# Patient Record
Sex: Female | Born: 2000 | Race: Black or African American | Hispanic: No | Marital: Single | Smoking: Never smoker
Health system: Southern US, Community
[De-identification: ages and names within clinical notes are randomized; demographics above are authoritative.]

## PROBLEM LIST (undated history)

## (undated) DIAGNOSIS — L732 Hidradenitis suppurativa: Secondary | ICD-10-CM

## (undated) DIAGNOSIS — F419 Anxiety disorder, unspecified: Secondary | ICD-10-CM

## (undated) DIAGNOSIS — F4310 Post-traumatic stress disorder, unspecified: Secondary | ICD-10-CM

## (undated) DIAGNOSIS — Z8679 Personal history of other diseases of the circulatory system: Secondary | ICD-10-CM

## (undated) DIAGNOSIS — M5430 Sciatica, unspecified side: Secondary | ICD-10-CM

## (undated) DIAGNOSIS — F32A Depression, unspecified: Secondary | ICD-10-CM

## (undated) HISTORY — DX: Personal history of other diseases of the circulatory system: Z86.79

## (undated) HISTORY — PX: TONSILLECTOMY: SUR1361

## (undated) HISTORY — DX: Sciatica, unspecified side: M54.30

## (undated) HISTORY — DX: Hidradenitis suppurativa: L73.2

## (undated) HISTORY — DX: Post-traumatic stress disorder, unspecified: F43.10

## (undated) HISTORY — DX: Depression, unspecified: F32.A

## (undated) HISTORY — DX: Anxiety disorder, unspecified: F41.9

## (undated) HISTORY — PX: NO PAST SURGERIES: SHX2092

---
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
## 2021-01-19 ENCOUNTER — Ambulatory Visit (HOSPITAL_COMMUNITY)
Admission: EM | Admit: 2021-01-19 | Discharge: 2021-01-19 | Disposition: A | Discharge status: Home / Self Care | Payer: Medicaid Other | Attending: Medical Oncology | Admitting: Medical Oncology

## 2021-01-19 ENCOUNTER — Other Ambulatory Visit: Payer: Self-pay

## 2021-01-19 ENCOUNTER — Encounter (HOSPITAL_COMMUNITY): Payer: Self-pay | Admitting: *Deleted

## 2021-01-19 DIAGNOSIS — G8929 Other chronic pain: Secondary | ICD-10-CM | POA: Diagnosis not present

## 2021-01-19 DIAGNOSIS — M545 Low back pain, unspecified: Secondary | ICD-10-CM

## 2021-01-19 DIAGNOSIS — J029 Acute pharyngitis, unspecified: Secondary | ICD-10-CM

## 2021-01-19 LAB — POCT RAPID STREP A, ED / UC: Streptococcus, Group A Screen (Direct): NEGATIVE

## 2021-01-19 LAB — HIV ANTIBODY (ROUTINE TESTING W REFLEX): HIV Screen 4th Generation wRfx: NONREACTIVE

## 2021-01-19 LAB — POCT INFECTIOUS MONO SCREEN, ED / UC: Mono Screen: NEGATIVE

## 2021-01-19 MED ORDER — METHOCARBAMOL 500 MG PO TABS: 500.0000 mg | ORAL_TABLET | Freq: Two times a day (BID) | ORAL | 0 refills | Status: DC

## 2021-01-19 NOTE — ED Triage Notes (Signed)
Pt has had sore throat off and on since FEB.2022, Lower back pain for 2 months HA/neck pain is new today

## 2021-01-19 NOTE — ED Provider Notes (Signed)
[facility]    CSN: [id number] Arrival date & time: 01/19/21  1038      History   Chief Complaint Chief Complaint  Patient presents with  . Back Pain  . Sore Throat  . Headache  . Oral Swelling    HPI Colleen Maxwell is a 20 y.o. female.   HPI   Sore Throat: Patient reports that since February she has had a sore throat and headache off and on.  She reports that in the beginning she was tested positive for strep pharyngitis and treated appropriately and symptoms improved however they did not fully resolve and they have worsened.  Since this time she states that she has had repeat testing for strep which was negative in office.  She denies any fevers, vomiting, trouble breathing or swallowing.    Back Pain: She reports that for the past few months she has had some low back pain especially when she is on her menstrual cycles.  Movement might make it worse.  She has not tried anything for symptoms.  She denies any dysuria, urinary frequency or known injury.  Past Medical History:  Diagnosis Date  . Asthma     Patient Active Problem List   Diagnosis Date Noted  . Pre-diabetes 10/12/2018  . Abnormal uterine bleeding 07/23/2018  . Sleep disturbance 08/21/2017  . BMI (body mass index), pediatric, > 99% for age 12/14/2016  . Mild intermittent asthma with acute exacerbation 09/14/2015  . Perennial allergic rhinitis 09/14/2015    Past Surgical History:  Procedure Laterality Date  . NO PAST SURGERIES      OB History   No obstetric history on file.      Home Medications    Prior to Admission medications   Medication Sig Start Date End Date Taking? Authorizing Provider  albuterol (PROVENTIL HFA;VENTOLIN HFA) 108 (90 BASE) MCG/ACT inhaler Inhale 2 puffs into the lungs every 4 (four) hours as needed for wheezing or shortness of breath.     [provider]  ibuprofen (ADVIL) 800 MG tablet Take 1 tablet (800 mg total) by mouth 3 (three) times daily with  meals. 08/03/19   Mardella Layman, MD  metoCLOPramide (REGLAN) 10 MG tablet Take 1 tablet (10 mg total) by mouth every 6 (six) hours as needed for nausea or vomiting. 12/26/20   Renne Crigler, PA-C  Norethindrone-Ethinyl Estradiol-Fe Biphas (LO LOESTRIN FE) 1 MG-10 MCG / 10 MCG tablet Take 1 tablet by mouth daily. 03/17/19   Leftwich-Kirby, Wilmer Floor, CNM  ondansetron (ZOFRAN) 4 MG tablet Take 1 tablet (4 mg total) by mouth every 8 (eight) hours as needed for nausea or vomiting. 03/17/19   Leftwich-Kirby, Wilmer Floor, CNM  pantoprazole (PROTONIX) 20 MG tablet Take 2 tablets (40 mg total) by mouth daily for 7 days. 07/12/18 07/19/18  Alvira Monday, MD    Family History Family History  Problem Relation Age of Onset  . Healthy Mother   . Healthy Father   . Healthy Sister   . Healthy Brother   . Healthy Brother     Social History Social History   Tobacco Use  . Smoking status: Passive Smoke Exposure - Never Smoker  . Smokeless tobacco: Never Used  Vaping Use  . Vaping Use: Never used  Substance Use Topics  . Alcohol use: No  . Drug use: No     Allergies   Patient has no known allergies.   Review of Systems Review of Systems  As stated above in HPI Physical  Exam Triage Vital Signs ED Triage Vitals  Enc Vitals Group     BP 01/19/21 1138 117/79     Pulse Rate 01/19/21 1138 88     Resp 01/19/21 1138 20     Temp 01/19/21 1138 98.7 F (37.1 C)     Temp Source 01/19/21 1138 Oral     SpO2 01/19/21 1138 100 %     Weight --      Height --      Head Circumference --      Peak Flow --      Pain Score 01/19/21 1141 7     Pain Loc --      Pain Edu? --      Excl. in GC? --    No data found.  Updated Vital Signs BP 117/79   Pulse 88   Temp 98.7 F (37.1 C) (Oral)   Resp 20   LMP 01/16/2021   SpO2 100%   Physical Exam Vitals and nursing note reviewed.  Constitutional:      General: She is not in acute distress.    Appearance: She is well-developed. She is not ill-appearing,  toxic-appearing or diaphoretic.  HENT:     Head: Normocephalic and atraumatic.     Right Ear: Tympanic membrane and ear canal normal. No tenderness. No middle ear effusion. Tympanic membrane is not erythematous.     Left Ear: Tympanic membrane and ear canal normal. No tenderness.  No middle ear effusion. Tympanic membrane is not erythematous.     Nose: No congestion or rhinorrhea.     Mouth/Throat:     Mouth: Mucous membranes are moist. No oral lesions.     Pharynx: Uvula midline. Oropharyngeal exudate and posterior oropharyngeal erythema present. No pharyngeal swelling or uvula swelling.     Tonsils: Tonsillar exudate present. No tonsillar abscesses. 3+ on the right. 3+ on the left.  Eyes:     Conjunctiva/sclera: Conjunctivae normal.     Pupils: Pupils are equal, round, and reactive to light.  Neck:     Thyroid: No thyromegaly.  Cardiovascular:     Rate and Rhythm: Normal rate and regular rhythm.     Heart sounds: Normal heart sounds.  Pulmonary:     Effort: Pulmonary effort is normal.     Breath sounds: Normal breath sounds.  Musculoskeletal:     Cervical back: Normal, normal range of motion and neck supple. No bony tenderness.     Thoracic back: Normal. No bony tenderness. Normal range of motion.     Lumbar back: Spasms and tenderness present. No bony tenderness. Normal range of motion. Negative right straight leg raise test and negative left straight leg raise test.       Back:  Lymphadenopathy:     Cervical: Cervical adenopathy present.  Skin:    General: Skin is warm.     Coloration: Skin is not pale.  Neurological:     Mental Status: She is alert and oriented to person, place, and time.      UC Treatments / Results  Labs (all labs ordered are listed, but only abnormal results are displayed) Labs Reviewed  CULTURE, GROUP A STREP ([facility])  SARS CORONAVIRUS 2 (TAT 6-24 HRS)  POCT RAPID STREP A, ED / UC    EKG   Radiology No results found.  Procedures Procedures  (including critical care time)  Medications Ordered in UC Medications - No data to display  Initial Impression / Assessment and Plan / UC Course  I  have reviewed the triage vital signs and the nursing notes.  Pertinent labs & imaging results that were available during my care of the patient were reviewed by me and considered in my medical decision making (see chart for details).     New.  Given her history I would like to expand her testing further and I discussed this with patient and she is agreeable.  We will treat as appropriate.  In terms of her back pain I suspect that she would benefit from a muscle relaxer and stretching exercises which we discussed.  Discussed how to use this medication along with common potential side effects and precautions. Final Clinical Impressions(s) / UC Diagnoses   Final diagnoses:  None   Discharge Instructions   None    ED Prescriptions    None     PDMP not reviewed this encounter.   Rushie Chestnut, [place] 01/19/21 2016

## 2021-01-21 LAB — CULTURE, GROUP A STREP (THRC)

## 2021-01-22 LAB — CYTOLOGY, (ORAL, ANAL, URETHRAL) ANCILLARY ONLY
Chlamydia: NEGATIVE
Comment: NEGATIVE
Comment: NEGATIVE
Comment: NORMAL
Neisseria Gonorrhea: NEGATIVE
Trichomonas: NEGATIVE

## 2021-03-06 ENCOUNTER — Other Ambulatory Visit: Payer: Self-pay

## 2021-03-06 ENCOUNTER — Ambulatory Visit (HOSPITAL_COMMUNITY)
Admission: EM | Admit: 2021-03-06 | Discharge: 2021-03-06 | Disposition: A | Discharge status: Home / Self Care | Payer: Medicaid Other | Attending: Emergency Medicine | Admitting: Emergency Medicine

## 2021-03-06 ENCOUNTER — Encounter (HOSPITAL_COMMUNITY): Payer: Self-pay

## 2021-03-06 DIAGNOSIS — J3501 Chronic tonsillitis: Secondary | ICD-10-CM | POA: Diagnosis not present

## 2021-03-06 MED ORDER — PREDNISONE 20 MG PO TABS: 40.0000 mg | ORAL_TABLET | Freq: Every day | ORAL | 0 refills | Status: DC

## 2021-03-06 MED ORDER — LIDOCAINE VISCOUS HCL 2 % MT SOLN: 15.0000 mL | OROMUCOSAL | 0 refills | Status: DC | PRN

## 2021-03-06 NOTE — Discharge Instructions (Addendum)
Take lidocaine solution 15 mLs every 4 hours as needed   Take 2 prednisone tablets every morning with food for the next 5 days  Follow up with ear nose throat specialist for evaluation for tonsil removal

## 2021-03-06 NOTE — ED Provider Notes (Addendum)
[facility]    CSN: [id number] Arrival date & time: 03/06/21  1309      History   Chief Complaint Chief Complaint  Patient presents with   swollen tonsils    HPI Colleen Maxwell is a 20 y.o. female.   Patient presents with swollen tonsils since February worse on left side. Able to tolerate food and liquids but causing mild discomfort. Denies fever, chills, URI symptoms, weakness, fatigue, difficulty swallowing, difficulty breathing, shortness of breath. [facility] and urgent care with no relief. PCP was going to place ENT referral but unsure what happened. History of asthma   Past Medical History:  Diagnosis Date   Asthma     Patient Active Problem List   Diagnosis Date Noted   Pre-diabetes 10/12/2018   Abnormal uterine bleeding 07/23/2018   Sleep disturbance 08/21/2017   BMI (body mass index), pediatric, > 99% for age 02/13/2017   Mild intermittent asthma with acute exacerbation 09/14/2015   Perennial allergic rhinitis 09/14/2015    Past Surgical History:  Procedure Laterality Date   NO PAST SURGERIES      OB History   No obstetric history on file.      Home Medications    Prior to Admission medications   Medication Sig Start Date End Date Taking? Authorizing Provider  lidocaine (XYLOCAINE) 2 % solution Use as directed 15 mLs in the mouth or throat as needed for mouth pain. 03/06/21  Yes Madie Cahn R, NP  predniSONE (DELTASONE) 20 MG tablet Take 2 tablets (40 mg total) by mouth daily. 03/06/21  Yes Terrance Usery R, NP  albuterol (PROVENTIL HFA;VENTOLIN HFA) 108 (90 BASE) MCG/ACT inhaler Inhale 2 puffs into the lungs every 4 (four) hours as needed for wheezing or shortness of breath.     [provider]  ibuprofen (ADVIL) 800 MG tablet Take 1 tablet (800 mg total) by mouth 3 (three) times daily with meals. 08/03/19   Mardella Layman, MD  methocarbamol (ROBAXIN) 500 MG tablet Take 1 tablet (500 mg total) by mouth  2 (two) times daily. 01/19/21   Rushie Chestnut, PA-C  metoCLOPramide (REGLAN) 10 MG tablet Take 1 tablet (10 mg total) by mouth every 6 (six) hours as needed for nausea or vomiting. 12/26/20   Renne Crigler, PA-C  Norethindrone-Ethinyl Estradiol-Fe Biphas (LO LOESTRIN FE) 1 MG-10 MCG / 10 MCG tablet Take 1 tablet by mouth daily. 03/17/19   Leftwich-Kirby, Wilmer Floor, CNM  ondansetron (ZOFRAN) 4 MG tablet Take 1 tablet (4 mg total) by mouth every 8 (eight) hours as needed for nausea or vomiting. 03/17/19   Leftwich-Kirby, Wilmer Floor, CNM  pantoprazole (PROTONIX) 20 MG tablet Take 2 tablets (40 mg total) by mouth daily for 7 days. 07/12/18 07/19/18  Alvira Monday, MD    Family History Family History  Problem Relation Age of Onset   Healthy Mother    Healthy Father    Healthy Sister    Healthy Brother    Healthy Brother     Social History Social History   Tobacco Use   Smoking status: Passive Smoke Exposure - Never Smoker   Smokeless tobacco: Never  Vaping Use   Vaping Use: Never used  Substance Use Topics   Alcohol use: No   Drug use: No     Allergies   Patient has no known allergies.   Review of Systems Review of Systems  Constitutional: Negative.   HENT:  Positive for sore throat. Negative  for congestion, dental problem, drooling, ear discharge, ear pain, facial swelling, hearing loss, mouth sores, nosebleeds, postnasal drip, rhinorrhea, sinus pressure, sinus pain, sneezing, tinnitus, trouble swallowing and voice change.   Respiratory: Negative.    Cardiovascular: Negative.   Neurological: Negative.     Physical Exam Triage Vital Signs ED Triage Vitals  Enc Vitals Group     BP 03/06/21 1409 130/78     Pulse Rate 03/06/21 1409 84     Resp 03/06/21 1409 18     Temp 03/06/21 1409 98.9 F (37.2 C)     Temp Source 03/06/21 1409 Oral     SpO2 03/06/21 1409 100 %     Weight 03/06/21 1402 241 lb 6.4 oz (109.5 kg)     Height --      Head Circumference --      Peak Flow --       Pain Score 03/06/21 1406 2     Pain Loc --      Pain Edu? --      Excl. in GC? --    No data found.  Updated Vital Signs BP 130/78 (BP Location: Left Arm)   Pulse 84   Temp 98.9 F (37.2 C) (Oral)   Resp 18   Wt 241 lb 6.4 oz (109.5 kg)   LMP 02/16/2021   SpO2 100%   BMI 34.64 kg/m   Visual Acuity Right Eye Distance:   Left Eye Distance:   Bilateral Distance:    Right Eye Near:   Left Eye Near:    Bilateral Near:     Physical Exam Constitutional:      Appearance: Normal appearance.  HENT:     Right Ear: Tympanic membrane, ear canal and external ear normal.     Left Ear: Tympanic membrane, ear canal and external ear normal.     Nose: Nose normal.     Mouth/Throat:     Mouth: Mucous membranes are dry.     Pharynx: Oropharynx is clear.     Tonsils: No tonsillar exudate or tonsillar abscesses. 3+ on the right. 3+ on the left.  Eyes:     Extraocular Movements: Extraocular movements intact.  Pulmonary:     Effort: Pulmonary effort is normal.  Lymphadenopathy:     Cervical: Cervical adenopathy present.  Skin:    General: Skin is warm and dry.  Neurological:     Mental Status: She is alert and oriented to person, place, and time. Mental status is at baseline.  Psychiatric:        Mood and Affect: Mood normal.        Behavior: Behavior normal.     UC Treatments / Results  Labs (all labs ordered are listed, but only abnormal results are displayed) Labs Reviewed - No data to display  EKG   Radiology No results found.  Procedures Procedures (including critical care time)  Medications Ordered in UC Medications - No data to display  Initial Impression / Assessment and Plan / UC Course  I have reviewed the triage vital signs and the nursing notes.  Pertinent labs & imaging results that were available during my care of the patient were reviewed by me and considered in my medical decision making (see chart for details).  Chronic tonsillitis   ENT  referral given Prednisone 40 mg daily for 5 days Lidocaine viscous 2% 15 mL every 4 hours prn Declined rapid strep, Monospot, oral cytology swab, and covid test today Final Clinical Impressions(s) / UC Diagnoses  Final diagnoses:  Tonsillitis, chronic     Discharge Instructions      Take lidocaine solution 15 mLs every 4 hours as needed   Take 2 prednisone tablets every morning with food for the next 5 days  Follow up with ear nose throat specialist for evaluation for tonsil removal    ED Prescriptions     Medication Sig Dispense Auth. Provider   predniSONE (DELTASONE) 20 MG tablet Take 2 tablets (40 mg total) by mouth daily. 10 tablet Kelbi Renstrom R, NP   lidocaine (XYLOCAINE) 2 % solution Use as directed 15 mLs in the mouth or throat as needed for mouth pain. 100 mL Valinda Hoar, NP      PDMP not reviewed this encounter.   Valinda Hoar, NP 03/06/21 1456    Valinda Hoar, NP 03/06/21 1457

## 2021-03-06 NOTE — ED Triage Notes (Signed)
Pt states her tonsils have been swollen since February. States she has pain, burning sensation, no difficulty breathing per patient. Pt states she has numbness to tongue when eating certain items, she does not recall having any food allergies.  Pt states she has tried mouth washes with no relief.

## 2021-03-08 ENCOUNTER — Other Ambulatory Visit: Payer: Self-pay

## 2021-03-08 ENCOUNTER — Emergency Department (HOSPITAL_COMMUNITY)
Admission: EM | Admit: 2021-03-08 | Discharge: 2021-03-08 | Disposition: A | Discharge status: Home / Self Care | Payer: Medicaid Other | Attending: Emergency Medicine | Admitting: Emergency Medicine

## 2021-03-08 ENCOUNTER — Encounter (HOSPITAL_COMMUNITY): Payer: Self-pay

## 2021-03-08 ENCOUNTER — Emergency Department (HOSPITAL_COMMUNITY)
Admission: EM | Admit: 2021-03-08 | Discharge: 2021-03-08 | Discharge status: Against Medical Advice | Payer: Medicaid Other

## 2021-03-08 DIAGNOSIS — Z7722 Contact with and (suspected) exposure to environmental tobacco smoke (acute) (chronic): Secondary | ICD-10-CM | POA: Insufficient documentation

## 2021-03-08 DIAGNOSIS — J4521 Mild intermittent asthma with (acute) exacerbation: Secondary | ICD-10-CM | POA: Insufficient documentation

## 2021-03-08 DIAGNOSIS — R5383 Other fatigue: Secondary | ICD-10-CM | POA: Diagnosis not present

## 2021-03-08 DIAGNOSIS — J039 Acute tonsillitis, unspecified: Secondary | ICD-10-CM | POA: Diagnosis not present

## 2021-03-08 DIAGNOSIS — J029 Acute pharyngitis, unspecified: Secondary | ICD-10-CM | POA: Diagnosis present

## 2021-03-08 HISTORY — DX: Hidradenitis suppurativa: L73.2

## 2021-03-08 MED ORDER — IBUPROFEN 600 MG PO TABS: 600.0000 mg | ORAL_TABLET | Freq: Four times a day (QID) | ORAL | 0 refills | Status: DC | PRN

## 2021-03-08 MED ORDER — CLINDAMYCIN HCL 150 MG PO CAPS: 150.0000 mg | ORAL_CAPSULE | Freq: Four times a day (QID) | ORAL | 0 refills | Status: DC

## 2021-03-08 NOTE — Discharge Instructions (Addendum)
Take ibuprofen as needed for pain.  Take antibiotic as prescribed.  Call and follow-up closely with an ear nose and throat specialist for further managements of your condition.

## 2021-03-08 NOTE — ED Notes (Signed)
Patient had not yet been triaged. The mother came in and said they are leaving. Nt in lobby advised her to stay but the mother refused and had her leave.

## 2021-03-08 NOTE — ED Provider Notes (Signed)
[facility]EMERGENCY [facility]PT Provider Note   CSN: [id number] Arrival date & time: 03/08/21  1731     History Chief Complaint  Patient presents with   Sore Throat    Colleen Maxwell is a 20 y.o. female.  The history is provided by the patient and medical records. No language interpreter was used.  Sore Throat   20 year old female with history of asthma presenting complaining of sore throat.  Patient states since February of this year she has had recurrent sore throat with enlarged tonsils.  She has been seen and evaluated multiple times her complaint and states she has been tested for everything including strep, mono, COVID, and other tests which are negative.  She initially was placed on penicillin.  She was seen at urgent care center 2 days ago for her complaint and was prescribed viscous lidocaine as well as steroid.  She has been taking the medication but today she also complaining of having headache neck pain and generalized fatigue.  Sore throat still persist.  No abdominal pain no nausea vomiting or diarrhea, light or sound sensitivity.  She does have an appointment with an ENT specialist a month from now but does not want to wait that long.  Past Medical History:  Diagnosis Date   Asthma    Hydradenitis     Patient Active Problem List   Diagnosis Date Noted   Pre-diabetes 10/12/2018   Abnormal uterine bleeding 07/23/2018   Sleep disturbance 08/21/2017   BMI (body mass index), pediatric, > 99% for age 76/14/2018   Mild intermittent asthma with acute exacerbation 09/14/2015   Perennial allergic rhinitis 09/14/2015    Past Surgical History:  Procedure Laterality Date   NO PAST SURGERIES       OB History   No obstetric history on file.     Family History  Problem Relation Age of Onset   Healthy Mother    Healthy Father    Healthy Sister    Healthy Brother    Healthy Brother     Social History   Tobacco Use   Smoking status: Never     Passive exposure: Yes   Smokeless tobacco: Never  Vaping Use   Vaping Use: Never used  Substance Use Topics   Alcohol use: No   Drug use: No    Home Medications Prior to Admission medications   Medication Sig Start Date End Date Taking? Authorizing Provider  albuterol (PROVENTIL HFA;VENTOLIN HFA) 108 (90 BASE) MCG/ACT inhaler Inhale 2 puffs into the lungs every 4 (four) hours as needed for wheezing or shortness of breath.     [provider]  ibuprofen (ADVIL) 800 MG tablet Take 1 tablet (800 mg total) by mouth 3 (three) times daily with meals. 08/03/19   Mardella Layman, MD  lidocaine (XYLOCAINE) 2 % solution Use as directed 15 mLs in the mouth or throat as needed for mouth pain. 03/06/21   White, Elita Boone, NP  methocarbamol (ROBAXIN) 500 MG tablet Take 1 tablet (500 mg total) by mouth 2 (two) times daily. 01/19/21   Rushie Chestnut, PA-C  metoCLOPramide (REGLAN) 10 MG tablet Take 1 tablet (10 mg total) by mouth every 6 (six) hours as needed for nausea or vomiting. 12/26/20   Renne Crigler, PA-C  Norethindrone-Ethinyl Estradiol-Fe Biphas (LO LOESTRIN FE) 1 MG-10 MCG / 10 MCG tablet Take 1 tablet by mouth daily. 03/17/19   Leftwich-Kirby, Wilmer Floor, CNM  ondansetron (ZOFRAN) 4 MG tablet Take 1 tablet (4 mg total)  by mouth every 8 (eight) hours as needed for nausea or vomiting. 03/17/19   Leftwich-Kirby, Wilmer Floor, CNM  pantoprazole (PROTONIX) 20 MG tablet Take 2 tablets (40 mg total) by mouth daily for 7 days. 07/12/18 07/19/18  Alvira Monday, MD  predniSONE (DELTASONE) 20 MG tablet Take 2 tablets (40 mg total) by mouth daily. 03/06/21   Valinda Hoar, NP    Allergies    Patient has no known allergies.  Review of Systems   Review of Systems  All other systems reviewed and are negative.  Physical Exam Updated Vital Signs BP 139/88 (BP Location: Right Arm)   Pulse 86   Temp 99.6 F (37.6 C) (Oral)   Resp 16   Ht 5\' 10"  (1.778 m)   Wt 112 kg   LMP 02/16/2021 (Approximate)    SpO2 98%   BMI 35.44 kg/m   Physical Exam Vitals and nursing note reviewed.  Constitutional:      General: She is not in acute distress.    Appearance: She is well-developed.  HENT:     Head: Atraumatic.     Nose: No congestion.     Mouth/Throat:     Mouth: Mucous membranes are moist.     Tonsils: 2+ on the right. 2+ on the left.     Comments: Uvula midline, bilateral tonsillar 2+ enlargement with mild posterior oropharyngeal erythema but no discharge noted.  No trismus.  Normal phonation Eyes:     Conjunctiva/sclera: Conjunctivae normal.  Cardiovascular:     Rate and Rhythm: Normal rate and regular rhythm.     Heart sounds: Normal heart sounds.  Pulmonary:     Effort: Pulmonary effort is normal.  Abdominal:     Palpations: Abdomen is soft.  Musculoskeletal:     Cervical back: Neck supple.  Skin:    Findings: No rash.  Neurological:     Mental Status: She is alert and oriented to person, place, and time.  Psychiatric:        Mood and Affect: Mood normal.    ED Results / Procedures / Treatments   Labs (all labs ordered are listed, but only abnormal results are displayed) Labs Reviewed - No data to display  EKG None  Radiology No results found.  Procedures Procedures   Medications Ordered in ED Medications - No data to display  ED Course  I have reviewed the triage vital signs and the nursing notes.  Pertinent labs & imaging results that were available during my care of the patient were reviewed by me and considered in my medical decision making (see chart for details).    MDM Rules/Calculators/A&P                          BP 139/88 (BP Location: Right Arm)   Pulse 86   Temp 99.6 F (37.6 C) (Oral)   Resp 16   Ht 5\' 10"  (1.778 m)   Wt 112 kg   LMP 02/16/2021 (Approximate)   SpO2 98%   BMI 35.44 kg/m   Final Clinical Impression(s) / ED Diagnoses Final diagnoses:  Tonsillitis    Rx / DC Orders ED Discharge Orders          Ordered     clindamycin (CLEOCIN) 150 MG capsule  Every 6 hours        03/08/21 2041    ibuprofen (ADVIL) 600 MG tablet  Every 6 hours PRN        03/08/21  2041           Patient with persistent sore throat and enlarged tonsils since February here with persistent symptoms.  She was seen 2 days ago for same and was prescribed steroid as well as viscous lidocaine without adequate relief.  It would be reasonable to prescribe clindamycin antibiotic and have patient follow-up with ENT for further care.  At this time she does not have any obvious signs to suggest airway compromise.  Low suspicion for deep tissue infection.  Patient overall well-appearing.  Recommend ibuprofen as needed for pain.  Return precaution given.   Fayrene Helper, PA-C 03/08/21 2046    Gerhard Munch, MD 03/08/21 [phone]

## 2021-03-08 NOTE — ED Triage Notes (Signed)
Patient states she has had tonsillitis since January/2022. Patient states she went to UC on 03/06/21 and was prescribed viscous xylocaine. Patient states she has been having severe headaches since last week. Patient reports a history of headaches, but states these are different.

## 2021-03-25 ENCOUNTER — Emergency Department (HOSPITAL_COMMUNITY)
Admission: EM | Admit: 2021-03-25 | Discharge: 2021-03-26 | Disposition: A | Discharge status: Home / Self Care | Payer: Medicaid Other | Attending: Emergency Medicine | Admitting: Emergency Medicine

## 2021-03-25 ENCOUNTER — Encounter (HOSPITAL_COMMUNITY): Payer: Self-pay

## 2021-03-25 ENCOUNTER — Other Ambulatory Visit: Payer: Self-pay

## 2021-03-25 DIAGNOSIS — M79632 Pain in left forearm: Secondary | ICD-10-CM | POA: Diagnosis not present

## 2021-03-25 DIAGNOSIS — M5431 Sciatica, right side: Secondary | ICD-10-CM

## 2021-03-25 DIAGNOSIS — J45909 Unspecified asthma, uncomplicated: Secondary | ICD-10-CM | POA: Insufficient documentation

## 2021-03-25 DIAGNOSIS — M545 Low back pain, unspecified: Secondary | ICD-10-CM | POA: Diagnosis present

## 2021-03-25 DIAGNOSIS — N644 Mastodynia: Secondary | ICD-10-CM | POA: Diagnosis not present

## 2021-03-25 DIAGNOSIS — H9201 Otalgia, right ear: Secondary | ICD-10-CM | POA: Insufficient documentation

## 2021-03-25 DIAGNOSIS — Z79899 Other long term (current) drug therapy: Secondary | ICD-10-CM | POA: Diagnosis not present

## 2021-03-25 LAB — COMPREHENSIVE METABOLIC PANEL
ALT: 11 U/L (ref 0–44)
AST: 17 U/L (ref 15–41)
Albumin: 4.5 g/dL (ref 3.5–5.0)
Alkaline Phosphatase: 62 U/L (ref 38–126)
Anion gap: 8 (ref 5–15)
BUN: 13 mg/dL (ref 6–20)
CO2: 27 mmol/L (ref 22–32)
Calcium: 9.3 mg/dL (ref 8.9–10.3)
Chloride: 105 mmol/L (ref 98–111)
Creatinine, Ser: 0.69 mg/dL (ref 0.44–1.00)
GFR, Estimated: >60 mL/min (ref >60)
Glucose, Bld: 86 mg/dL (ref 70–99)
Potassium: 3.7 mmol/L (ref 3.5–5.1)
Sodium: 140 mmol/L (ref 135–145)
Total Bilirubin: 0.7 mg/dL (ref 0.3–1.2)
Total Protein: 7.7 g/dL (ref 6.5–8.1)

## 2021-03-25 LAB — CBC WITH DIFFERENTIAL/PLATELET
Abs Immature Granulocytes: 0.02 10*3/uL (ref 0.00–0.07)
Basophils Absolute: 0 10*3/uL (ref 0.0–0.1)
Basophils Relative: 0 %
Eosinophils Absolute: 0.1 10*3/uL (ref 0.0–0.5)
Eosinophils Relative: 2 %
HCT: 38.2 % (ref 36.0–46.0)
Hemoglobin: 12.6 g/dL (ref 12.0–15.0)
Immature Granulocytes: 0 %
Lymphocytes Relative: 36 %
Lymphs Abs: 2.8 10*3/uL (ref 0.7–4.0)
MCH: 29.3 pg (ref 26.0–34.0)
MCHC: 33 g/dL (ref 30.0–36.0)
MCV: 88.8 fL (ref 80.0–100.0)
Monocytes Absolute: 0.6 10*3/uL (ref 0.1–1.0)
Monocytes Relative: 7 %
Neutro Abs: 4.3 10*3/uL (ref 1.7–7.7)
Neutrophils Relative %: 55 %
Platelets: 291 10*3/uL (ref 150–400)
RBC: 4.3 MIL/uL (ref 3.87–5.11)
RDW: 12.7 % (ref 11.5–15.5)
WBC: 7.9 10*3/uL (ref 4.0–10.5)
nRBC: 0 % (ref 0.0–0.2)

## 2021-03-25 MED ORDER — NAPROXEN 500 MG PO TABS
500.0000 mg | ORAL_TABLET | Freq: Once | ORAL | Status: AC
  Administered 2021-03-25: 500 mg via ORAL
  Filled 2021-03-25: qty 1

## 2021-03-25 MED ORDER — CYCLOBENZAPRINE HCL 10 MG PO TABS: 10.0000 mg | ORAL_TABLET | Freq: Three times a day (TID) | ORAL | 0 refills | Status: DC | PRN

## 2021-03-25 MED ORDER — CYCLOBENZAPRINE HCL 10 MG PO TABS
10.0000 mg | ORAL_TABLET | Freq: Once | ORAL | Status: AC
  Administered 2021-03-25: 10 mg via ORAL
  Filled 2021-03-25: qty 1

## 2021-03-25 MED ORDER — NAPROXEN 375 MG PO TABS: ORAL_TABLET | ORAL | 0 refills | Status: DC

## 2021-03-25 NOTE — ED Provider Notes (Signed)
Emergency Medicine Provider Triage Evaluation Note  Colleen Maxwell , a 20 y.o. female  was evaluated in triage.  Pt complains of back pain and right leg pain for 2 months, intermittent.. No trauma to back. Numbess and tingling in leg on R side.   Review of Systems  Positive: back pain and R leg pain, paraesthesias  Negative: Fever   Physical Exam  BP (!) 143/103 (BP Location: Left Arm)   Pulse 88   Temp 100 F (37.8 C) (Oral)   Resp 17   Ht 5\' 10"  (1.778 m)   Wt 111.1 kg   LMP 03/16/2021 (Exact Date)   SpO2 97%   BMI 35.15 kg/m  Gen:   Awake, no distress   Resp:  Normal effort  MSK:   Moves extremities without difficulty, normal gait  Other:    Medical Decision Making  Medically screening exam initiated at 10:00 PM.  Appropriate orders placed.  Colleen Maxwell was informed that the remainder of the evaluation will be completed by another provider, this initial triage assessment does not replace that evaluation, and the importance of remaining in the ED until their evaluation is complete.     Amaryllis Dyke, PA-C 03/25/21 03/27/21    [id number], MD 03/25/21 2325

## 2021-03-25 NOTE — ED Triage Notes (Signed)
Pt present with multiple complaints: lower back pain that is worse on the right, left hip pain that radiates down to her right foot, left forearm pain, headache, right ear pain, and right breast pain. Pt reports all of these complaints have been going on for a week or longer. Pt is A&Ox4. Pt states she has taken ibuprofen yesterday which helps. Pt also states she is taken clindamycin too due to being diagnosed with tonsillitis.

## 2021-03-25 NOTE — ED Provider Notes (Signed)
[facility] Provider Note: Lowella Dell, MD, FACEP  CSN: [id number] MRN: [id number] ARRIVAL: 03/25/21 at 2002 ROOM: WA23/WA23   CHIEF COMPLAINT  Leg Pain and Back Pain   HISTORY OF PRESENT ILLNESS  03/25/21 11:35 PM Colleen Maxwell is a 20 y.o. female who had an episode of back pain about 3 months ago.  The pain was on the left lumbar region and radiated down the back of her left leg to her foot.  She had an x-ray of her back at [facility] but was lost to follow-up.  The pain resolved on its own.  She is here with a return of that pain for about a week.  She rates the pain as a 9 out of 10, worse with movement of her right hip or lower back.  She describes the pain as a tight feeling.  She has taken ibuprofen without adequate relief.  She is currently being treated for recurrent tonsillitis.  She is taking clindamycin.  She has a follow-up appointment at [facility] in about a week.  She is also having some other aches and pains including her left forearm right ear and right breast that have been going on for more than a week as well.  She states that these are not concerning her right now and she really wants to focus only on the back pain.   Past Medical History:  Diagnosis Date   Asthma    Hydradenitis     Past Surgical History:  Procedure Laterality Date   NO PAST SURGERIES      Family History  Problem Relation Age of Onset   Healthy Mother    Healthy Father    Healthy Sister    Healthy Brother    Healthy Brother     Social History   Tobacco Use   Smoking status: Never    Passive exposure: Yes   Smokeless tobacco: Never  Vaping Use   Vaping Use: Never used  Substance Use Topics   Alcohol use: No   Drug use: No    Prior to Admission medications   Medication Sig Start Date End Date Taking? Authorizing Provider  cyclobenzaprine (FLEXERIL) 10 MG tablet Take 1 tablet (10 mg total) by mouth 3 (three) times daily as needed for muscle spasms. 03/25/21  Yes  Annaka Cleaver, MD  naproxen (NAPROSYN) 375 MG tablet Take 1 tablet twice daily for back pain. 03/25/21  Yes Estie Sproule, MD  albuterol (PROVENTIL HFA;VENTOLIN HFA) 108 (90 BASE) MCG/ACT inhaler Inhale 2 puffs into the lungs every 4 (four) hours as needed for wheezing or shortness of breath.     [provider]  clindamycin (CLEOCIN) 150 MG capsule Take 1 capsule (150 mg total) by mouth every 6 (six) hours. 03/08/21   Fayrene Helper, PA-C  lidocaine (XYLOCAINE) 2 % solution Use as directed 15 mLs in the mouth or throat as needed for mouth pain. 03/06/21   Valinda Hoar, NP  metoCLOPramide (REGLAN) 10 MG tablet Take 1 tablet (10 mg total) by mouth every 6 (six) hours as needed for nausea or vomiting. 12/26/20   Renne Crigler, PA-C  pantoprazole (PROTONIX) 20 MG tablet Take 2 tablets (40 mg total) by mouth daily for 7 days. 07/12/18 07/19/18  Alvira Monday, MD  predniSONE (DELTASONE) 20 MG tablet Take 2 tablets (40 mg total) by mouth daily. 03/06/21   Valinda Hoar, NP    Allergies Patient has no known allergies.   REVIEW OF SYSTEMS  Negative  except as noted here or in the History of Present Illness.   PHYSICAL EXAMINATION  Initial Vital Signs Blood pressure (!) 128/96, pulse 65, temperature 100 F (37.8 C), temperature source Oral, resp. rate 15, height 5\' 10"  (1.778 m), weight 111.1 kg, last menstrual period 03/16/2021, SpO2 100 %.  Examination General: Well-developed, well-nourished female in no acute distress; appearance consistent with age of record HENT: normocephalic; atraumatic; tonsillar enlargement without exudate or significant erythema Eyes: pupils equal, round and reactive to light; extraocular muscles intact Neck: supple; no lymphadenopathy Heart: regular rate and rhythm Lungs: clear to auscultation bilaterally Abdomen: soft; nondistended; nontender; bowel sounds present Back: Right paralumbar tenderness; pain on straight leg raise on the right at about 45  degrees Extremities: No deformity; full range of motion; pulses normal Neurologic: Awake, alert and oriented; motor function intact in all extremities and symmetric; no facial droop Skin: Warm and dry Psychiatric: Normal mood and affect   RESULTS  Summary of this visit's results, reviewed and interpreted by myself:   EKG Interpretation  Date/Time:    Ventricular Rate:    PR Interval:    QRS Duration:   QT Interval:    QTC Calculation:   R Axis:     Text Interpretation:         Laboratory Studies: Results for orders placed or performed during the hospital encounter of 03/25/21 (from the past 24 hour(s))  CBC with Differential     Status: None   Collection Time: 03/25/21 11:15 PM  Result Value Ref Range   WBC 7.9 4.0 - 10.5 K/uL   RBC 4.30 3.87 - 5.11 MIL/uL   Hemoglobin 12.6 12.0 - 15.0 g/dL   HCT 03/27/21 98.9 - 21.1 %   MCV 88.8 80.0 - 100.0 fL   MCH 29.3 26.0 - 34.0 pg   MCHC 33.0 30.0 - 36.0 g/dL   RDW 94.1 74.0 - 81.4 %   Platelets 291 150 - 400 K/uL   nRBC 0.0 0.0 - 0.2 %   Neutrophils Relative % 55 %   Neutro Abs 4.3 1.7 - 7.7 K/uL   Lymphocytes Relative 36 %   Lymphs Abs 2.8 0.7 - 4.0 K/uL   Monocytes Relative 7 %   Monocytes Absolute 0.6 0.1 - 1.0 K/uL   Eosinophils Relative 2 %   Eosinophils Absolute 0.1 0.0 - 0.5 K/uL   Basophils Relative 0 %   Basophils Absolute 0.0 0.0 - 0.1 K/uL   Immature Granulocytes 0 %   Abs Immature Granulocytes 0.02 0.00 - 0.07 K/uL  Comprehensive metabolic panel     Status: None   Collection Time: 03/25/21 11:15 PM  Result Value Ref Range   Sodium 140 135 - 145 mmol/L   Potassium 3.7 3.5 - 5.1 mmol/L   Chloride 105 98 - 111 mmol/L   CO2 27 22 - 32 mmol/L   Glucose, Bld 86 70 - 99 mg/dL   BUN 13 6 - 20 mg/dL   Creatinine, Ser 03/27/21 0.44 - 1.00 mg/dL   Calcium 9.3 8.9 - 8.56 mg/dL   Total Protein 7.7 6.5 - 8.1 g/dL   Albumin 4.5 3.5 - 5.0 g/dL   AST 17 15 - 41 U/L   ALT 11 0 - 44 U/L   Alkaline Phosphatase 62 38 - 126 U/L    Total Bilirubin 0.7 0.3 - 1.2 mg/dL   GFR, Estimated 31.4 >97 mL/min   Anion gap 8 5 - 15   Imaging Studies: No results found.  ED COURSE  and MDM  Nursing notes, initial and subsequent vitals signs, including pulse oximetry, reviewed and interpreted by myself.  Vitals:   03/25/21 2015 03/25/21 2045 03/25/21 2315  BP: (!) 143/103  (!) 128/96  Pulse: 88  65  Resp: 17  15  Temp: 100 F (37.8 C)    TempSrc: Oral    SpO2: 97%  100%  Weight: 105.5 kg 111.1 kg   Height: 5\' 10"  (1.778 m) 5\' 10"  (1.778 m)    Medications  naproxen (NAPROSYN) tablet 500 mg (has no administration in time range)  cyclobenzaprine (FLEXERIL) tablet 10 mg (has no administration in time range)   We will treat the patient with naproxen and Flexeril and refer to sports medicine.  She was advised that back pain in her age group is usually acute and temporary but if she keeps having episodes or if this episode persists she may need an MRI.  She was added with a course of steroids on 03/06/2021 for her tonsillitis and states she did not like the side effects.   PROCEDURES  Procedures   ED DIAGNOSES     ICD-10-CM   1. Sciatica of right side  M54.31          Rockney Grenz, MD 03/25/21 2351

## 2021-03-29 ENCOUNTER — Other Ambulatory Visit: Payer: Self-pay

## 2021-03-29 ENCOUNTER — Ambulatory Visit (INDEPENDENT_AMBULATORY_CARE_PROVIDER_SITE_OTHER): Payer: Medicaid Other | Admitting: Family Medicine

## 2021-03-29 VITALS — Ht 70.0 in | Wt 240.0 lb

## 2021-03-29 DIAGNOSIS — M5416 Radiculopathy, lumbar region: Secondary | ICD-10-CM | POA: Diagnosis not present

## 2021-03-29 NOTE — Patient Instructions (Signed)
Nice to meet you  Please try heat  Please try the exercise  Please try physical therapy  Please send me a message in MyChart with any questions or updates.  Please see me back in 4 weeks.   --Dr. Jordan Likes

## 2021-03-29 NOTE — Assessment & Plan Note (Signed)
Symptoms most consistent with spasm in the back with radicular pain down the leg.  Currently occurring intermittently -Counseled on home exercise therapy and supportive care. -Referral to physical therapy. -Could consider gabapentin.
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
## 2021-04-01 ENCOUNTER — Other Ambulatory Visit: Payer: Self-pay

## 2021-04-01 ENCOUNTER — Emergency Department (HOSPITAL_COMMUNITY)
Admission: EM | Admit: 2021-04-01 | Discharge: 2021-04-01 | Disposition: A | Discharge status: Home / Self Care | Payer: Medicaid Other | Attending: Emergency Medicine | Admitting: Emergency Medicine

## 2021-04-01 DIAGNOSIS — J4521 Mild intermittent asthma with (acute) exacerbation: Secondary | ICD-10-CM | POA: Insufficient documentation

## 2021-04-01 DIAGNOSIS — R000 Tachycardia, unspecified: Secondary | ICD-10-CM | POA: Diagnosis not present

## 2021-04-01 DIAGNOSIS — U071 COVID-19: Secondary | ICD-10-CM | POA: Diagnosis not present

## 2021-04-01 DIAGNOSIS — Z20822 Contact with and (suspected) exposure to covid-19: Secondary | ICD-10-CM

## 2021-04-01 DIAGNOSIS — R059 Cough, unspecified: Secondary | ICD-10-CM | POA: Diagnosis present

## 2021-04-01 NOTE — ED Provider Notes (Signed)
[facility] EMERGENCY DEPARTMENT Provider Note   CSN: [id number] Arrival date & time: 04/01/21  2203     History No chief complaint on file.   Colleen Maxwell is a 20 y.o. female presenting for URI symptoms in the setting of recent COVID exposure.  patient states the last 24 hours she has developed nasal congestion, cough, nausea. Sob yesterday. Has a h/o asthma, used inhaler with relief. No other medical problems. No cp, abd pain, urinary sxs or abnormal BMs. She is vaccinated for covid  HPI     Past Medical History:  Diagnosis Date   Asthma    Hydradenitis     Patient Active Problem List   Diagnosis Date Noted   Lumbar radiculopathy 03/29/2021   Pre-diabetes 10/12/2018   Abnormal uterine bleeding 07/23/2018   Sleep disturbance 08/21/2017   BMI (body mass index), pediatric, > 99% for age 34/14/2018   Mild intermittent asthma with acute exacerbation 09/14/2015   Perennial allergic rhinitis 09/14/2015    Past Surgical History:  Procedure Laterality Date   NO PAST SURGERIES       OB History   No obstetric history on file.     Family History  Problem Relation Age of Onset   Healthy Mother    Healthy Father    Healthy Sister    Healthy Brother    Healthy Brother     Social History   Tobacco Use   Smoking status: Never    Passive exposure: Yes   Smokeless tobacco: Never  Vaping Use   Vaping Use: Never used  Substance Use Topics   Alcohol use: No   Drug use: No    Home Medications Prior to Admission medications   Medication Sig Start Date End Date Taking? Authorizing Provider  albuterol (PROVENTIL HFA;VENTOLIN HFA) 108 (90 BASE) MCG/ACT inhaler Inhale 2 puffs into the lungs every 4 (four) hours as needed for wheezing or shortness of breath.     [provider]  clindamycin (CLEOCIN) 150 MG capsule Take 1 capsule (150 mg total) by mouth every 6 (six) hours. 03/08/21   Fayrene Helper, PA-C  cyclobenzaprine (FLEXERIL) 10 MG tablet  Take 1 tablet (10 mg total) by mouth 3 (three) times daily as needed for muscle spasms. 03/25/21   Molpus, John, MD  lidocaine (XYLOCAINE) 2 % solution Use as directed 15 mLs in the mouth or throat as needed for mouth pain. 03/06/21   Valinda Hoar, NP  metoCLOPramide (REGLAN) 10 MG tablet Take 1 tablet (10 mg total) by mouth every 6 (six) hours as needed for nausea or vomiting. 12/26/20   Renne Crigler, PA-C  naproxen (NAPROSYN) 375 MG tablet Take 1 tablet twice daily for back pain. 03/25/21   Molpus, John, MD  pantoprazole (PROTONIX) 20 MG tablet Take 2 tablets (40 mg total) by mouth daily for 7 days. 07/12/18 07/19/18  Alvira Monday, MD  predniSONE (DELTASONE) 20 MG tablet Take 2 tablets (40 mg total) by mouth daily. 03/06/21   Valinda Hoar, NP    Allergies    Patient has no known allergies.  Review of Systems   Review of Systems  Constitutional:  Negative for fever.  HENT:  Positive for congestion.   Respiratory:  Positive for cough and shortness of breath.   Gastrointestinal:  Positive for nausea.  Musculoskeletal:  Negative for myalgias.  Skin:  Negative for rash.  Allergic/Immunologic: Negative for immunocompromised state.  Neurological:  Negative for headaches.  Psychiatric/Behavioral:  Negative for confusion.  Physical Exam Updated Vital Signs BP 120/78 (BP Location: Left Arm)   Pulse (!) 114   Temp 98.1 F (36.7 C) (Oral)   Resp 20   Ht 5\' 10"  (1.778 m)   Wt 108.9 kg   LMP 03/16/2021 (Exact Date)   SpO2 100%   BMI 34.44 kg/m   Physical Exam Vitals and nursing note reviewed.  Constitutional:      General: She is not in acute distress.    Appearance: Normal appearance.  HENT:     Head: Normocephalic and atraumatic.     Right Ear: Tympanic membrane, ear canal and external ear normal.     Left Ear: Tympanic membrane, ear canal and external ear normal.     Mouth/Throat:     Pharynx: Uvula midline.  Eyes:     Extraocular Movements: Extraocular movements  intact.     Conjunctiva/sclera: Conjunctivae normal.     Pupils: Pupils are equal, round, and reactive to light.  Cardiovascular:     Rate and Rhythm: Regular rhythm. Tachycardia present.     Pulses: Normal pulses.     Comments: HR 105 on my exam Pulmonary:     Effort: Pulmonary effort is normal.     Breath sounds: Normal breath sounds. No decreased breath sounds, wheezing, rhonchi or rales.     Comments: Speaking in full sentences.  Clear lung sounds in all fields.  Sats stable on room air. Abdominal:     General: There is no distension.     Palpations: Abdomen is soft.     Tenderness: There is no abdominal tenderness.  Musculoskeletal:        General: Normal range of motion.     Cervical back: Normal range of motion.  Lymphadenopathy:     Cervical: No cervical adenopathy.  Skin:    General: Skin is warm.     Capillary Refill: Capillary refill takes less than 2 seconds.  Neurological:     Mental Status: She is alert and oriented to person, place, and time.    ED Results / Procedures / Treatments   Labs (all labs ordered are listed, but only abnormal results are displayed) Labs Reviewed  SARS CORONAVIRUS 2 (TAT 6-24 HRS)    EKG None  Radiology No results found.  Procedures Procedures   Medications Ordered in ED Medications - No data to display  ED Course  I have reviewed the triage vital signs and the nursing notes.  Pertinent labs & imaging results that were available during my care of the patient were reviewed by me and considered in my medical decision making (see chart for details).    MDM Rules/Calculators/A&P                            Patient presenting for COVID testing.  URI symptoms in the last 24 hours, recent COVID exposure.  On exam, pulmonary exam is reassuring.  Sats stable.  Patient does not need hospitalization or further work-up for COVID.  Testing sent.  Discussed symptomatic management and strict return precautions.  At this time, patient  appears safe for discharge.  Return precautions given.  Patient states she understands and agrees to plan.   Colleen Maxwell was evaluated in Emergency Department on 04/01/2021 for the symptoms described in the history of present illness. She was evaluated in the context of the global COVID-19 pandemic, which necessitated consideration that the patient might be at risk for infection with the SARS-CoV-2  virus that causes COVID-19. Institutional protocols and algorithms that pertain to the evaluation of patients at risk for COVID-19 are in a state of rapid change based on information released by regulatory bodies including the CDC and federal and state organizations. These policies and algorithms were followed during the patient's care in the ED.   Final Clinical Impression(s) / ED Diagnoses Final diagnoses:  Suspected COVID-19 virus infection  Exposure to COVID-19 virus    Rx / DC Orders ED Discharge Orders     None        Alveria Apley, PA-C 04/01/21 2308    Pollyann Savoy, MD 04/01/21 [phone]

## 2021-04-01 NOTE — Discharge Instructions (Addendum)
Your covid results are pending.  You can see the final result in MyChart. COVID is a virus, will need to be treated symptomatically. Use Tylenol or ibuprofen as needed for fever, body ache, headache, sore throat. Make sure you are staying well-hydrated with water. Use your albuterol inhaler as needed for wheezing, chest tightness, shortness of breath. Return to the emergency room with any new, worsening, concerning symptoms

## 2021-04-01 NOTE — ED Triage Notes (Signed)
Report exposure to covid and stuffy nose

## 2021-04-02 LAB — SARS CORONAVIRUS 2 (TAT 6-24 HRS): SARS Coronavirus 2: POSITIVE — AB

## 2021-04-05 ENCOUNTER — Ambulatory Visit: Payer: Medicaid Other | Attending: Family Medicine

## 2021-04-08 ENCOUNTER — Encounter (HOSPITAL_COMMUNITY): Payer: Self-pay | Admitting: *Deleted

## 2021-04-08 ENCOUNTER — Other Ambulatory Visit: Payer: Self-pay

## 2021-04-08 ENCOUNTER — Ambulatory Visit (HOSPITAL_COMMUNITY)
Admission: EM | Admit: 2021-04-08 | Discharge: 2021-04-08 | Disposition: A | Discharge status: Home / Self Care | Payer: Medicaid Other | Attending: Physician Assistant | Admitting: Physician Assistant

## 2021-04-08 DIAGNOSIS — R109 Unspecified abdominal pain: Secondary | ICD-10-CM

## 2021-04-08 DIAGNOSIS — R197 Diarrhea, unspecified: Secondary | ICD-10-CM | POA: Diagnosis not present

## 2021-04-08 DIAGNOSIS — R112 Nausea with vomiting, unspecified: Secondary | ICD-10-CM | POA: Diagnosis not present

## 2021-04-08 LAB — POCT URINALYSIS DIPSTICK, ED / UC
Bilirubin Urine: NEGATIVE
Glucose, UA: NEGATIVE mg/dL
Hgb urine dipstick: NEGATIVE
Ketones, ur: NEGATIVE mg/dL
Leukocytes,Ua: NEGATIVE
Nitrite: NEGATIVE
Protein, ur: NEGATIVE mg/dL
Specific Gravity, Urine: 1.025 (ref 1.005–1.030)
Urobilinogen, UA: 0.2 mg/dL (ref 0.0–1.0)
pH: 5 (ref 5.0–8.0)

## 2021-04-08 LAB — POC URINE PREG, ED: Preg Test, Ur: NEGATIVE

## 2021-04-08 MED ORDER — ONDANSETRON 4 MG PO TBDP
ORAL_TABLET | ORAL | Status: AC
  Filled 2021-04-08: qty 1

## 2021-04-08 MED ORDER — ONDANSETRON 4 MG PO TBDP
4.0000 mg | ORAL_TABLET | Freq: Once | ORAL | Status: AC
  Administered 2021-04-08: 4 mg via ORAL

## 2021-04-08 NOTE — ED Triage Notes (Signed)
Pt reports Diarrhea for one week, Bilatera breast pain and ABD Pain. Pt also reports weight loss over one month.

## 2021-04-08 NOTE — Discharge Instructions (Addendum)
Go to the [facility] department if symptoms persist more than 12 hours. Schedule to see primary care if symptoms persist

## 2021-04-09 NOTE — ED Provider Notes (Signed)
[facility]    CSN: [id number] Arrival date & time: 04/08/21  1549      History   Chief Complaint Chief Complaint  Patient presents with   Diarrhea   Weight Loss   Abdominal Pain   Breast Pain    HPI Colleen Maxwell is a 20 y.o. female.   Pt complains of diffuse abdominal pain today.  Pt has had diarrhea on and off for a week.  Pt reports she has been losing weight.  Pt has had multiple episodes of tonsillitis   The history is provided by the patient. No language interpreter was used.  Diarrhea Quality:  Unable to specify Severity:  Mild Onset quality:  Gradual Duration:  1 week Timing:  Constant Progression:  Worsening Relieved by:  Nothing Worsened by:  Nothing Associated symptoms: abdominal pain and vomiting   Risk factors: recent antibiotic use   Abdominal Pain Pain quality: aching   Pain radiates to:  Does not radiate Progression:  Worsening Chronicity:  New Associated symptoms: diarrhea and vomiting    Past Medical History:  Diagnosis Date   Asthma    Hydradenitis     Patient Active Problem List   Diagnosis Date Noted   Lumbar radiculopathy 03/29/2021   Pre-diabetes 10/12/2018   Abnormal uterine bleeding 07/23/2018   Sleep disturbance 08/21/2017   BMI (body mass index), pediatric, > 99% for age 33/14/2018   Mild intermittent asthma with acute exacerbation 09/14/2015   Perennial allergic rhinitis 09/14/2015    Past Surgical History:  Procedure Laterality Date   NO PAST SURGERIES      OB History   No obstetric history on file.      Home Medications    Prior to Admission medications   Medication Sig Start Date End Date Taking? Authorizing Provider  albuterol (PROVENTIL HFA;VENTOLIN HFA) 108 (90 BASE) MCG/ACT inhaler Inhale 2 puffs into the lungs every 4 (four) hours as needed for wheezing or shortness of breath.    Yes [provider]  clindamycin (CLEOCIN) 150 MG capsule Take 1 capsule (150 mg total) by mouth every 6  (six) hours. 03/08/21   Fayrene Helper, PA-C  cyclobenzaprine (FLEXERIL) 10 MG tablet Take 1 tablet (10 mg total) by mouth 3 (three) times daily as needed for muscle spasms. 03/25/21   Molpus, John, MD  lidocaine (XYLOCAINE) 2 % solution Use as directed 15 mLs in the mouth or throat as needed for mouth pain. 03/06/21   Valinda Hoar, NP  metoCLOPramide (REGLAN) 10 MG tablet Take 1 tablet (10 mg total) by mouth every 6 (six) hours as needed for nausea or vomiting. 12/26/20   Renne Crigler, PA-C  naproxen (NAPROSYN) 375 MG tablet Take 1 tablet twice daily for back pain. 03/25/21   Molpus, John, MD  pantoprazole (PROTONIX) 20 MG tablet Take 2 tablets (40 mg total) by mouth daily for 7 days. 07/12/18 07/19/18  Alvira Monday, MD  predniSONE (DELTASONE) 20 MG tablet Take 2 tablets (40 mg total) by mouth daily. 03/06/21   Valinda Hoar, NP    Family History Family History  Problem Relation Age of Onset   Healthy Mother    Healthy Father    Healthy Sister    Healthy Brother    Healthy Brother     Social History Social History   Tobacco Use   Smoking status: Never    Passive exposure: Yes   Smokeless tobacco: Never  Vaping Use   Vaping Use: Never used  Substance Use  Topics   Alcohol use: No   Drug use: No     Allergies   Patient has no known allergies.   Review of Systems Review of Systems  Gastrointestinal:  Positive for abdominal pain, diarrhea and vomiting.  All other systems reviewed and are negative.   Physical Exam Triage Vital Signs ED Triage Vitals  Enc Vitals Group     BP 04/08/21 1712 107/65     Pulse Rate 04/08/21 1712 90     Resp 04/08/21 1712 18     Temp 04/08/21 1712 98.2 F (36.8 C)     Temp src --      SpO2 04/08/21 1712 96 %     Weight 04/08/21 1718 225 lb (102.1 kg)     Height --      Head Circumference --      Peak Flow --      Pain Score 04/08/21 1714 4     Pain Loc --      Pain Edu? --      Excl. in GC? --    No data found.  Updated Vital  Signs BP 107/65   Pulse 90   Temp 98.2 F (36.8 C)   Resp 18   Wt 102.1 kg   LMP 03/16/2021 (Exact Date)   SpO2 96%   BMI 32.28 kg/m   Visual Acuity Right Eye Distance:   Left Eye Distance:   Bilateral Distance:    Right Eye Near:   Left Eye Near:    Bilateral Near:     Physical Exam Vitals and nursing note reviewed.  Constitutional:      Appearance: She is well-developed.  HENT:     Head: Normocephalic.  Pulmonary:     Effort: Pulmonary effort is normal.  Abdominal:     General: Bowel sounds are normal. There is no distension.     Palpations: Abdomen is soft.     Tenderness: There is no abdominal tenderness.  Musculoskeletal:        General: Normal range of motion.     Cervical back: Normal range of motion.  Neurological:     Mental Status: She is alert and oriented to person, place, and time.     UC Treatments / Results  Labs (all labs ordered are listed, but only abnormal results are displayed) Labs Reviewed  POCT URINALYSIS DIPSTICK, ED / UC  POC URINE PREG, ED    EKG   Radiology No results found.  Procedures Procedures (including critical care time)  Medications Ordered in UC Medications  ondansetron (ZOFRAN-ODT) disintegrating tablet 4 mg (4 mg Oral Given 04/08/21 1748)    Initial Impression / Assessment and Plan / UC Course  I have reviewed the triage vital signs and the nursing notes.  Pertinent labs & imaging results that were available during my care of the patient were reviewed by me and considered in my medical decision making (see chart for details).     MDM:  Pt given zofran.  Pt able to tolerate fluids,  Pt counseled on diarrhea,  Abdominal exam is normal   Pt advised to ED if symptoms persist  Final Clinical Impressions(s) / UC Diagnoses   Final diagnoses:  Diarrhea, unspecified type     Discharge Instructions      Go to the [facility] department if symptoms persist more than 12 hours. Schedule to see primary care if  symptoms persist    ED Prescriptions   None    PDMP not reviewed this  encounter. An After Visit Summary was printed and given to the patient.    Elson Areas, [place] 04/09/21 1656

## 2021-05-03 ENCOUNTER — Ambulatory Visit: Payer: Medicaid Other | Admitting: Family Medicine

## 2021-05-16 ENCOUNTER — Ambulatory Visit: Payer: Self-pay | Admitting: Allergy
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
## 2021-06-08 ENCOUNTER — Other Ambulatory Visit: Payer: Self-pay

## 2021-06-08 ENCOUNTER — Ambulatory Visit (HOSPITAL_COMMUNITY)
Admission: EM | Admit: 2021-06-08 | Discharge: 2021-06-08 | Disposition: A | Discharge status: Home / Self Care | Payer: Medicaid Other

## 2021-06-09 ENCOUNTER — Encounter (HOSPITAL_COMMUNITY): Payer: Self-pay | Admitting: *Deleted

## 2021-06-09 ENCOUNTER — Ambulatory Visit (HOSPITAL_COMMUNITY)
Admission: RE | Admit: 2021-06-09 | Discharge: 2021-06-09 | Disposition: A | Discharge status: Home / Self Care | Payer: Medicaid Other | Source: Ambulatory Visit | Attending: Student | Admitting: Student

## 2021-06-09 ENCOUNTER — Other Ambulatory Visit: Payer: Self-pay

## 2021-06-09 ENCOUNTER — Emergency Department (HOSPITAL_COMMUNITY)
Admission: EM | Admit: 2021-06-09 | Discharge: 2021-06-09 | Disposition: A | Discharge status: Home / Self Care | Payer: Medicaid Other | Attending: Emergency Medicine | Admitting: Emergency Medicine

## 2021-06-09 ENCOUNTER — Encounter (HOSPITAL_COMMUNITY): Payer: Self-pay

## 2021-06-09 ENCOUNTER — Emergency Department (HOSPITAL_COMMUNITY): Payer: Medicaid Other

## 2021-06-09 VITALS — BP 110/76 | HR 117 | Temp 98.9°F | Resp 19

## 2021-06-09 DIAGNOSIS — R300 Dysuria: Secondary | ICD-10-CM | POA: Diagnosis not present

## 2021-06-09 DIAGNOSIS — R000 Tachycardia, unspecified: Secondary | ICD-10-CM | POA: Insufficient documentation

## 2021-06-09 DIAGNOSIS — R0602 Shortness of breath: Secondary | ICD-10-CM

## 2021-06-09 DIAGNOSIS — B3731 Acute candidiasis of vulva and vagina: Secondary | ICD-10-CM | POA: Insufficient documentation

## 2021-06-09 DIAGNOSIS — Z7722 Contact with and (suspected) exposure to environmental tobacco smoke (acute) (chronic): Secondary | ICD-10-CM | POA: Diagnosis not present

## 2021-06-09 DIAGNOSIS — R0789 Other chest pain: Secondary | ICD-10-CM | POA: Insufficient documentation

## 2021-06-09 DIAGNOSIS — M79602 Pain in left arm: Secondary | ICD-10-CM | POA: Diagnosis not present

## 2021-06-09 DIAGNOSIS — R079 Chest pain, unspecified: Secondary | ICD-10-CM | POA: Diagnosis present

## 2021-06-09 DIAGNOSIS — R5383 Other fatigue: Secondary | ICD-10-CM | POA: Insufficient documentation

## 2021-06-09 DIAGNOSIS — Z20822 Contact with and (suspected) exposure to covid-19: Secondary | ICD-10-CM | POA: Diagnosis not present

## 2021-06-09 DIAGNOSIS — J4521 Mild intermittent asthma with (acute) exacerbation: Secondary | ICD-10-CM | POA: Diagnosis not present

## 2021-06-09 LAB — BASIC METABOLIC PANEL
Anion gap: 11 (ref 5–15)
BUN: 12 mg/dL (ref 6–20)
CO2: 23 mmol/L (ref 22–32)
Calcium: 9.6 mg/dL (ref 8.9–10.3)
Chloride: 103 mmol/L (ref 98–111)
Creatinine, Ser: 0.79 mg/dL (ref 0.44–1.00)
GFR, Estimated: >60 mL/min (ref >60)
Glucose, Bld: 103 mg/dL — ABNORMAL HIGH (ref 70–99)
Potassium: 3.7 mmol/L (ref 3.5–5.1)
Sodium: 137 mmol/L (ref 135–145)

## 2021-06-09 LAB — CBC
HCT: 42 % (ref 36.0–46.0)
Hemoglobin: 13.8 g/dL (ref 12.0–15.0)
MCH: 28.8 pg (ref 26.0–34.0)
MCHC: 32.9 g/dL (ref 30.0–36.0)
MCV: 87.5 fL (ref 80.0–100.0)
Platelets: 293 10*3/uL (ref 150–400)
RBC: 4.8 MIL/uL (ref 3.87–5.11)
RDW: 12.4 % (ref 11.5–15.5)
WBC: 8.2 10*3/uL (ref 4.0–10.5)
nRBC: 0 % (ref 0.0–0.2)

## 2021-06-09 LAB — TROPONIN I (HIGH SENSITIVITY)
Troponin I (High Sensitivity): <2 ng/L (ref <18)
Troponin I (High Sensitivity): <2 ng/L (ref <18)

## 2021-06-09 LAB — RESP PANEL BY RT-PCR (FLU A&B, COVID) ARPGX2
Influenza A by PCR: NEGATIVE
Influenza B by PCR: NEGATIVE
SARS Coronavirus 2 by RT PCR: NEGATIVE

## 2021-06-09 LAB — D-DIMER, QUANTITATIVE: D-Dimer, Quant: 0.32 ug/mL-FEU (ref 0.00–0.50)

## 2021-06-09 MED ORDER — FLUCONAZOLE 150 MG PO TABS: 150.0000 mg | ORAL_TABLET | Freq: Every day | ORAL | 0 refills | Status: DC

## 2021-06-09 NOTE — ED Provider Notes (Signed)
[facility] EMERGENCY DEPARTMENT Provider Note   CSN: [id number] Arrival date & time: 06/09/21  1528     History Chief Complaint  Patient presents with   Generalized Body Aches    Colleen Maxwell is a 20 y.o. female with a past medical history significant for recent pericarditis versus other heart palpitations or abnormality currently being followed by [facility] as well as chronic tonsillitis since February with plans for surgery that was missed yesterday due to chest pain who is presenting with multiple complaints including chest pain, tachycardia, vague left arm pain, and yeast infection secondary to recently treated UTI.  Patient reports the chest pain has been ongoing for a month, also associated with shortness of breath, with some worry for anxiety versus panic attack.  Patient reports that she also has generally been feeling unwell, some headache without vision changes, neck stiffness that started gradually, as well as general malaise without fever for the last day or 2.  At time of my evaluation patient reports that chest pain no longer present however she is still anxious about her tachycardia, was told by urgent care that she may have a blood clot in her lungs.  In regards to possible blood clot, patient has had no recent travel, is not taking birth control, does not have any history of coagulopathy, does report that she has been generally less active since recent diagnosis of perspective pericarditis and cardiology evaluation.  HPI     Past Medical History:  Diagnosis Date   Asthma    Hydradenitis     Patient Active Problem List   Diagnosis Date Noted   Lumbar radiculopathy 03/29/2021   Pre-diabetes 10/12/2018   Abnormal uterine bleeding 07/23/2018   Sleep disturbance 08/21/2017   BMI (body mass index), pediatric, > 99% for age 45/14/2018   Mild intermittent asthma with acute exacerbation 09/14/2015   Perennial allergic rhinitis 09/14/2015    Past  Surgical History:  Procedure Laterality Date   NO PAST SURGERIES       OB History   No obstetric history on file.     Family History  Problem Relation Age of Onset   Healthy Mother    Healthy Father    Healthy Sister    Healthy Brother    Healthy Brother     Social History   Tobacco Use   Smoking status: Never    Passive exposure: Yes   Smokeless tobacco: Never  Vaping Use   Vaping Use: Never used  Substance Use Topics   Alcohol use: No   Drug use: No    Home Medications Prior to Admission medications   Medication Sig Start Date End Date Taking? Authorizing Provider  fluconazole (DIFLUCAN) 150 MG tablet Take 1 tablet (150 mg total) by mouth daily. Take 1 tablet as soon as you fill the prescription, take a second tablet in 7 days if you continue to have symptoms. 06/09/21  Yes Oluwanifemi Susman H, PA-C  albuterol (PROVENTIL HFA;VENTOLIN HFA) 108 (90 BASE) MCG/ACT inhaler Inhale 2 puffs into the lungs every 4 (four) hours as needed for wheezing or shortness of breath.     [provider]  clindamycin (CLEOCIN) 150 MG capsule Take 1 capsule (150 mg total) by mouth every 6 (six) hours. 03/08/21   Fayrene Helper, PA-C  cyclobenzaprine (FLEXERIL) 10 MG tablet Take 1 tablet (10 mg total) by mouth 3 (three) times daily as needed for muscle spasms. 03/25/21   Molpus, John, MD  lidocaine (XYLOCAINE) 2 %  solution Use as directed 15 mLs in the mouth or throat as needed for mouth pain. 03/06/21   Valinda Hoar, NP  metoCLOPramide (REGLAN) 10 MG tablet Take 1 tablet (10 mg total) by mouth every 6 (six) hours as needed for nausea or vomiting. 12/26/20   Renne Crigler, PA-C  naproxen (NAPROSYN) 375 MG tablet Take 1 tablet twice daily for back pain. 03/25/21   Molpus, John, MD  pantoprazole (PROTONIX) 20 MG tablet Take 2 tablets (40 mg total) by mouth daily for 7 days. 07/12/18 07/19/18  Alvira Monday, MD  predniSONE (DELTASONE) 20 MG tablet Take 2 tablets (40 mg total) by mouth  daily. 03/06/21   Valinda Hoar, NP    Allergies    Patient has no known allergies.  Review of Systems   Review of Systems  Respiratory:  Positive for shortness of breath.   Cardiovascular:  Positive for chest pain and palpitations.  Genitourinary:        Vaginal itching  Psychiatric/Behavioral:  The patient is nervous/anxious.   All other systems reviewed and are negative.  Physical Exam Updated Vital Signs BP 124/80 (BP Location: Right Arm)   Pulse 96   Temp 99.5 F (37.5 C) (Oral)   Resp 18   Ht 5\' 2"  (1.575 m)   Wt 96.6 kg   LMP 05/17/2021 (Exact Date)   SpO2 97%   BMI 38.96 kg/m   Physical Exam Vitals and nursing note reviewed.  Constitutional:      General: She is not in acute distress.    Appearance: Normal appearance.     Comments: Pleasant young female sitting on bed in no acute distress  HENT:     Head: Normocephalic and atraumatic.     Comments: 2+ tonsils with minimal white exudate, and erythema.  No evidence of peritonsillar abscess, no uvula deviation. Eyes:     General:        Right eye: No discharge.        Left eye: No discharge.  Cardiovascular:     Rate and Rhythm: Normal rate and regular rhythm.     Heart sounds: No murmur heard.   No friction rub. No gallop.     Comments: This palpation of chest wall in center and on left. Pulmonary:     Effort: Pulmonary effort is normal.     Breath sounds: Normal breath sounds.  Abdominal:     General: Bowel sounds are normal.     Palpations: Abdomen is soft.  Musculoskeletal:     Comments: Tenderness to palpation, swelling, evidence of blood clot in bilateral upper or lower extremities.  Negative Thompson test x2.  Skin:    General: Skin is warm and dry.     Capillary Refill: Capillary refill takes less than 2 seconds.  Neurological:     Mental Status: She is alert and oriented to person, place, and time.  Psychiatric:        Mood and Affect: Mood normal.        Behavior: Behavior normal.     ED Results / Procedures / Treatments   Labs (all labs ordered are listed, but only abnormal results are displayed) Labs Reviewed  BASIC METABOLIC PANEL - Abnormal; Notable for the following components:      Result Value   Glucose, Bld 103 (*)    All other components within normal limits  RESP PANEL BY RT-PCR (FLU A&B, COVID) ARPGX2  CBC  D-DIMER, QUANTITATIVE  TROPONIN I (HIGH SENSITIVITY)  TROPONIN  I (HIGH SENSITIVITY)    EKG None  Radiology DG Chest 2 View  Result Date: 06/09/2021 CLINICAL DATA:  Chest pain EXAM: CHEST - 2 VIEW COMPARISON:  October 17, 2011 FINDINGS: The heart size and mediastinal contours are within normal limits. Both lungs are clear. The visualized skeletal structures are unremarkable. IMPRESSION: No active cardiopulmonary disease. Electronically Signed   By: Sherian Rein M.D.   On: 06/09/2021 16:10    Procedures Procedures   Medications Ordered in ED Medications - No data to display  ED Course  I have reviewed the triage vital signs and the nursing notes.  Pertinent labs & imaging results that were available during my care of the patient were reviewed by me and considered in my medical decision making (see chart for details).    MDM Rules/Calculators/A&P                         Initial lab work-up is consistent for troponin x2 without abnormality.  Negative chest x-ray.  Negative EKG.  Chest pain is reproducible on chest wall.  Chest pain is not associated with activity, diaphoresis, nausea or vomiting, with no radiation into arms or back.  Patient seemingly with some vague unrelated pain in the left arm not reproducible on physical exam.  As patient is very low risk for blood clot or pulmonary embolism based on risk factors, however she is tachycardic we will obtain a D-dimer at this time, respiratory panel to assess for flu and COVID given patient's general malaise and ongoing throat pain.  Reports dysuria and urinary frequency from her urinary  tract infection have improved, however she has developed yeast infection which is normal for her when she has been taking antibiotics.  We will prescribe Diflucan for yeast infection symptoms.  At this time D-dimer is negative.  COVID-negative.  Tachycardia resolved without intervention, some suspicion that anxiety is playing a role in patient's symptomology. Recommend patient follow-up with [facility] cardiology for further evaluation, follow-up with her ENT surgeon for chronic tonsillitis.  No evidence of acute or emergent cardiac, pulmonary, or other mediastinal emergent condition at this time including aortic dissection, aortic aneurysm, Boerhaave's, flail chest.  Patient discharged in stable condition at this time with Diflucan for yeast infection.  Encouraged follow-up as discussed above.  Return precautions were given. Final Clinical Impression(s) / ED Diagnoses Final diagnoses:  Chest pain, unspecified type  Vaginal candidiasis    Rx / DC Orders ED Discharge Orders          Ordered    fluconazole (DIFLUCAN) 150 MG tablet  Daily        06/09/21 2313             Olene Floss, PA-C 06/09/21 2323    Wynetta Fines, MD 06/11/21 2321

## 2021-06-09 NOTE — ED Provider Notes (Signed)
[facility]    CSN: [id number] Arrival date & time: 06/09/21  1339      History   Chief Complaint Chief Complaint  Patient presents with   Appointment    HPI Colleen Maxwell is a 20 y.o. female presenting with constellation of symptoms.  Medical history palpitations, which she is currently being worked up for at [facility] cardiology.  States that they think she might have pericarditis.  States that she was treated for a yeast infection 6 days ago, which seems to have precipitated the symptoms.  Describes central chest pain, worse with movement and inspiration, left arm pain, palpitations, shortness of breath.  Symptoms are worse with exertion.  States that her heart rate is often high, but never this high.  HPI  Past Medical History:  Diagnosis Date   Asthma    Hydradenitis     Patient Active Problem List   Diagnosis Date Noted   Lumbar radiculopathy 03/29/2021   Pre-diabetes 10/12/2018   Abnormal uterine bleeding 07/23/2018   Sleep disturbance 08/21/2017   BMI (body mass index), pediatric, > 99% for age 43/14/2018   Mild intermittent asthma with acute exacerbation 09/14/2015   Perennial allergic rhinitis 09/14/2015    Past Surgical History:  Procedure Laterality Date   NO PAST SURGERIES      OB History   No obstetric history on file.      Home Medications    Prior to Admission medications   Medication Sig Start Date End Date Taking? Authorizing Provider  albuterol (PROVENTIL HFA;VENTOLIN HFA) 108 (90 BASE) MCG/ACT inhaler Inhale 2 puffs into the lungs every 4 (four) hours as needed for wheezing or shortness of breath.     [provider]  clindamycin (CLEOCIN) 150 MG capsule Take 1 capsule (150 mg total) by mouth every 6 (six) hours. 03/08/21   Fayrene Helper, PA-C  cyclobenzaprine (FLEXERIL) 10 MG tablet Take 1 tablet (10 mg total) by mouth 3 (three) times daily as needed for muscle spasms. 03/25/21   Molpus, John, MD  lidocaine (XYLOCAINE) 2 %  solution Use as directed 15 mLs in the mouth or throat as needed for mouth pain. 03/06/21   Valinda Hoar, NP  metoCLOPramide (REGLAN) 10 MG tablet Take 1 tablet (10 mg total) by mouth every 6 (six) hours as needed for nausea or vomiting. 12/26/20   Renne Crigler, PA-C  naproxen (NAPROSYN) 375 MG tablet Take 1 tablet twice daily for back pain. 03/25/21   Molpus, John, MD  pantoprazole (PROTONIX) 20 MG tablet Take 2 tablets (40 mg total) by mouth daily for 7 days. 07/12/18 07/19/18  Alvira Monday, MD  predniSONE (DELTASONE) 20 MG tablet Take 2 tablets (40 mg total) by mouth daily. 03/06/21   Valinda Hoar, NP    Family History Family History  Problem Relation Age of Onset   Healthy Mother    Healthy Father    Healthy Sister    Healthy Brother    Healthy Brother     Social History Social History   Tobacco Use   Smoking status: Never    Passive exposure: Yes   Smokeless tobacco: Never  Vaping Use   Vaping Use: Never used  Substance Use Topics   Alcohol use: No   Drug use: No     Allergies   Patient has no known allergies.   Review of Systems Review of Systems  Respiratory:  Positive for shortness of breath.   Cardiovascular:  Positive for chest pain and palpitations.  All other systems reviewed and are negative.   Physical Exam Triage Vital Signs ED Triage Vitals  Enc Vitals Group     BP 06/09/21 1419 110/76     Pulse Rate 06/09/21 1419 (!) 117     Resp 06/09/21 1419 19     Temp 06/09/21 1419 98.9 F (37.2 C)     Temp Source 06/09/21 1419 Oral     SpO2 06/09/21 1419 97 %     Weight --      Height --      Head Circumference --      Peak Flow --      Pain Score 06/09/21 1417 3     Pain Loc --      Pain Edu? --      Excl. in GC? --    No data found.  Updated Vital Signs BP 110/76 (BP Location: Right Arm)   Pulse (!) 117 Comment: Pt states HR has been  high recently  Temp 98.9 F (37.2 C) (Oral)   Resp 19   LMP 05/17/2021 (Exact Date)   SpO2 97%    Visual Acuity Right Eye Distance:   Left Eye Distance:   Bilateral Distance:    Right Eye Near:   Left Eye Near:    Bilateral Near:     Physical Exam Vitals reviewed.  Constitutional:      Appearance: Normal appearance. She is not diaphoretic.  HENT:     Head: Normocephalic and atraumatic.     Mouth/Throat:     Mouth: Mucous membranes are moist.  Eyes:     Extraocular Movements: Extraocular movements intact.     Pupils: Pupils are equal, round, and reactive to light.  Cardiovascular:     Rate and Rhythm: Normal rate and regular rhythm.     Pulses:          Radial pulses are 2+ on the right side and 2+ on the left side.     Heart sounds: Normal heart sounds.  Pulmonary:     Effort: Pulmonary effort is normal.     Breath sounds: Normal breath sounds.  Chest:     Chest wall: Tenderness present.     Comments: Reproducible chest wall pain Abdominal:     Palpations: Abdomen is soft.     Tenderness: There is no abdominal tenderness. There is no guarding or rebound.  Musculoskeletal:     Right lower leg: No edema.     Left lower leg: No edema.  Skin:    General: Skin is warm.     Capillary Refill: Capillary refill takes less than 2 seconds.  Neurological:     General: No focal deficit present.     Mental Status: She is alert and oriented to person, place, and time.  Psychiatric:        Mood and Affect: Mood normal.        Behavior: Behavior normal.        Thought Content: Thought content normal.        Judgment: Judgment normal.     UC Treatments / Results  Labs (all labs ordered are listed, but only abnormal results are displayed) Labs Reviewed - No data to display  EKG   Radiology No results found.  Procedures Procedures (including critical care time)  Medications Ordered in UC Medications - No data to display  Initial Impression / Assessment and Plan / UC Course  I have reviewed the triage vital signs and the nursing notes.  Pertinent labs &  imaging results that were available during my care of the patient were reviewed by me and considered in my medical decision making (see chart for details).     This patient is a very pleasant 20 y.o. year old female presenting with chest pain, palpitations, shortness of breath, tachycardia for few days following treatment for vaginal candidiasis.  I do have concern for acute PE given presentation. Chest wall pain is reproducible, but cannot rule out acute cardiopulmonary event. She is currently being worked up for possible pericarditis by [facility] cardiology. EKG today with sinus tachy, no prior EKG for comparison  Sent to emergency department via personal vehicle.  Patient and parent are in agreement.  Final Clinical Impressions(s) / UC Diagnoses   Final diagnoses:  Atypical chest pain  Shortness of breath     Discharge Instructions      -Head straight to the ED. If symptoms get worse on the way, stop and call 911.   ED Prescriptions   None    PDMP not reviewed this encounter.   Rhys Martini, PA-C 06/09/21 1459

## 2021-06-09 NOTE — Discharge Instructions (Signed)
-  Head straight to the ED. If symptoms get worse on the way, stop and call 911.

## 2021-06-09 NOTE — Discharge Instructions (Addendum)
Follow-up with your cardiologist for further evaluation.  You can take ibuprofen and Tylenol as needed for the pain.  Please follow-up with your ENT regarding their recurrent tonsillitis.  As we discussed there is no evidence of cardiac, pulmonary or other abnormality inside your chest today.  You have no evidence of a blood clot anywhere in your body.

## 2021-06-09 NOTE — ED Triage Notes (Signed)
The pt has multiple complaints  chest pain bi-lateral arm pain sore thgroat yeast infection  she was seen and diagnosed with pericarditis f she has antibiotics hx of anxiety she was [facility]  she also reports a rapid heart rate     lmp sept 15th

## 2021-06-09 NOTE — ED Triage Notes (Signed)
Pt present with c/o lower back pain, headache, shoulder pain and chest pain. States the sxs started after she started her antibiotics for a yeast infection 6 days ago.

## 2021-06-09 NOTE — ED Provider Notes (Signed)
Emergency Medicine Provider Triage Evaluation Note  Colleen Maxwell , a 20 y.o. female  was evaluated in triage.  Pt complains of chest pain that is been ongoing for the past month.  Diagnosed with pericarditis, was placed on medication.  Followed by [facility] cardiology.  She was also placed on antibiotics, which she reports caused a yeast infection.  States that after she was told she had pericarditis she had a panic attack.  Feels very nervous on today's visit.  Also endorses shortness of breath, however does have a history of asthma.  No prior history of CAD, no family history of CAD, no history of tobacco use.  Review of Systems  Positive: Chest pain, shortness of breath Negative: Fever, nausea, urinary symptoms  Physical Exam  BP (!) 135/93 (BP Location: Right Arm)   Pulse (!) 110   Temp 98.4 F (36.9 C)   Resp 16   LMP 05/17/2021 (Exact Date)   SpO2 100%  Gen:   Awake, no distress   Resp:  Normal effort  MSK:   Moves extremities without difficulty  Other:  Anxious, overall well-appearing with stable vital signs aside from slight elevation of her heart rate.  Lungs are clear to auscultation, abdomen is soft nontender to palpation.  Medical Decision Making  Medically screening exam initiated at 3:33 PM.  Appropriate orders placed.  Colleen Maxwell was informed that the remainder of the evaluation will be completed by another provider, this initial triage assessment does not replace that evaluation, and the importance of remaining in the ED until their evaluation is complete.     Claude Manges, PA-C 06/09/21 1537    Gerhard Munch, MD 06/09/21 [phone]

## 2021-06-09 NOTE — ED Notes (Signed)
Patient is being discharged from the Urgent Care and sent to the Emergency Department via POV . Per Ignacia Bayley , patient is in need of higher level of care due to tachycardia. Patient is aware and verbalizes understanding of plan of care.  Vitals:   06/09/21 1419  BP: 110/76  Pulse: (!) 117  Resp: 19  Temp: 98.9 F (37.2 C)  SpO2: 97%

## 2021-07-20 ENCOUNTER — Encounter (HOSPITAL_COMMUNITY): Payer: Self-pay

## 2021-07-20 ENCOUNTER — Ambulatory Visit (HOSPITAL_COMMUNITY)
Admission: EM | Admit: 2021-07-20 | Discharge: 2021-07-20 | Disposition: A | Discharge status: Home / Self Care | Payer: Medicaid Other | Attending: Nurse Practitioner | Admitting: Nurse Practitioner

## 2021-07-20 ENCOUNTER — Other Ambulatory Visit: Payer: Self-pay

## 2021-07-20 DIAGNOSIS — M545 Low back pain, unspecified: Secondary | ICD-10-CM

## 2021-07-20 DIAGNOSIS — R002 Palpitations: Secondary | ICD-10-CM | POA: Diagnosis not present

## 2021-07-20 DIAGNOSIS — R350 Frequency of micturition: Secondary | ICD-10-CM | POA: Insufficient documentation

## 2021-07-20 LAB — POCT URINALYSIS DIPSTICK, ED / UC
Bilirubin Urine: NEGATIVE
Glucose, UA: NEGATIVE mg/dL
Hgb urine dipstick: NEGATIVE
Ketones, ur: NEGATIVE mg/dL
Leukocytes,Ua: NEGATIVE
Nitrite: NEGATIVE
Protein, ur: NEGATIVE mg/dL
Specific Gravity, Urine: <=1.005 (ref 1.005–1.030)
Urobilinogen, UA: 0.2 mg/dL (ref 0.0–1.0)
pH: 5 (ref 5.0–8.0)

## 2021-07-20 LAB — POC URINE PREG, ED: Preg Test, Ur: NEGATIVE

## 2021-07-20 NOTE — ED Provider Notes (Signed)
[facility]    CSN: [id number] Arrival date & time: 07/20/21  1144      History   Chief Complaint Chief Complaint  Patient presents with   Back Pain    HPI Colleen Maxwell is a 20 y.o. female.   Subjective:  Colleen Maxwell is a 20 y.o. female who complains of foul smelling urine, frequency, suprapubic pressure, and low back pain for the past 2 days. She's also had some nausea. Patient denies fever, vaginal discharge, dysuria, flank pain, vomiting, vaginal irritation or vaginal itching. The patient does not have a history of recurrent UTI.  Patient does not have a history of pyelonephritis.  LMP - started Sunday 11/13 and ended on Tuesday 11/15. Patient reports that this is very unusual as her cycle usually lasts 5 days. She denies any recent sexual activity. She is not on birth control and has no concerns for STD   Notably, the patient has had non-specific chest pain and palpitations over the past couple of months. She has had several ED and UC evaluations within the [facility] system for this. She admits to having anxiety and thinks that it contributes a lot to her symptoms. She has been prescribed colchicine back in September by a provider [facility] [employer]. She has not had any imaging to date. She is scheduled to see cardiology later this month.   The following portions of the patient's history were reviewed and updated as appropriate: allergies, current medications, past family history, past medical history, past social history, past surgical history, and problem list.     Past Medical History:  Diagnosis Date   Asthma    Hydradenitis     Patient Active Problem List   Diagnosis Date Noted   Lumbar radiculopathy 03/29/2021   Pre-diabetes 10/12/2018   Abnormal uterine bleeding 07/23/2018   Sleep disturbance 08/21/2017   BMI (body mass index), pediatric, > 99% for age 47/14/2018   Mild intermittent asthma with acute exacerbation  09/14/2015   Perennial allergic rhinitis 09/14/2015    Past Surgical History:  Procedure Laterality Date   NO PAST SURGERIES      OB History   No obstetric history on file.      Home Medications    Prior to Admission medications   Medication Sig Start Date End Date Taking? Authorizing Provider  albuterol (PROVENTIL HFA;VENTOLIN HFA) 108 (90 BASE) MCG/ACT inhaler Inhale 2 puffs into the lungs every 4 (four) hours as needed for wheezing or shortness of breath.     [provider]  clindamycin (CLEOCIN) 150 MG capsule Take 1 capsule (150 mg total) by mouth every 6 (six) hours. 03/08/21   Fayrene Helper, PA-C  cyclobenzaprine (FLEXERIL) 10 MG tablet Take 1 tablet (10 mg total) by mouth 3 (three) times daily as needed for muscle spasms. 03/25/21   Molpus, John, MD  fluconazole (DIFLUCAN) 150 MG tablet Take 1 tablet (150 mg total) by mouth daily. Take 1 tablet as soon as you fill the prescription, take a second tablet in 7 days if you continue to have symptoms. 06/09/21   Prosperi, Christian H, PA-C  lidocaine (XYLOCAINE) 2 % solution Use as directed 15 mLs in the mouth or throat as needed for mouth pain. 03/06/21   Valinda Hoar, NP  metoCLOPramide (REGLAN) 10 MG tablet Take 1 tablet (10 mg total) by mouth every 6 (six) hours as needed for nausea or vomiting. 12/26/20   Renne Crigler, PA-C  naproxen (  NAPROSYN) 375 MG tablet Take 1 tablet twice daily for back pain. 03/25/21   Molpus, John, MD  pantoprazole (PROTONIX) 20 MG tablet Take 2 tablets (40 mg total) by mouth daily for 7 days. 07/12/18 07/19/18  Alvira Monday, MD  predniSONE (DELTASONE) 20 MG tablet Take 2 tablets (40 mg total) by mouth daily. 03/06/21   Valinda Hoar, NP    Family History Family History  Problem Relation Age of Onset   Healthy Mother    Healthy Father    Healthy Sister    Healthy Brother    Healthy Brother     Social History Social History   Tobacco Use   Smoking status: Never    Passive  exposure: Yes   Smokeless tobacco: Never  Vaping Use   Vaping Use: Never used  Substance Use Topics   Alcohol use: No   Drug use: No     Allergies   Patient has no known allergies.   Review of Systems Review of Systems  Constitutional:  Negative for fatigue and fever.  Respiratory:  Positive for chest tightness. Negative for cough and shortness of breath.   Cardiovascular:  Positive for chest pain and palpitations. Negative for leg swelling.  Gastrointestinal:  Positive for nausea. Negative for vomiting.  Genitourinary:  Positive for frequency, menstrual problem and pelvic pain. Negative for dysuria and vaginal discharge.  Musculoskeletal:  Positive for back pain.  Skin:  Negative for rash.  Neurological:  Negative for dizziness.  All other systems reviewed and are negative.   Physical Exam Triage Vital Signs ED Triage Vitals  Enc Vitals Group     BP 07/20/21 1346 (!) 140/91     Pulse Rate 07/20/21 1346 (!) 107     Resp 07/20/21 1346 18     Temp 07/20/21 1346 99.5 F (37.5 C)     Temp Source 07/20/21 1346 Oral     SpO2 07/20/21 1346 98 %     Weight --      Height --      Head Circumference --      Peak Flow --      Pain Score 07/20/21 1344 0     Pain Loc --      Pain Edu? --      Excl. in GC? --    No data found.  Updated Vital Signs BP (!) 140/91 (BP Location: Left Arm)   Pulse (!) 107   Temp 99.5 F (37.5 C) (Oral)   Resp 18   LMP 07/14/2021 (Exact Date)   SpO2 98%   Visual Acuity Right Eye Distance:   Left Eye Distance:   Bilateral Distance:    Right Eye Near:   Left Eye Near:    Bilateral Near:     Physical Exam Vitals and nursing note reviewed.  Constitutional:      General: She is not in acute distress.    Appearance: Normal appearance. She is not ill-appearing or toxic-appearing.  HENT:     Head: Normocephalic.  Cardiovascular:     Rate and Rhythm: Normal rate.  Pulmonary:     Effort: Pulmonary effort is normal.  Abdominal:      General: There is no distension.     Palpations: Abdomen is soft.     Tenderness: There is no abdominal tenderness. There is no right CVA tenderness, left CVA tenderness or guarding.  Musculoskeletal:        General: Normal range of motion.     Cervical back: Normal  range of motion and neck supple.  Skin:    General: Skin is warm and dry.  Neurological:     General: No focal deficit present.     Mental Status: She is alert and oriented to person, place, and time.     UC Treatments / Results  Labs (all labs ordered are listed, but only abnormal results are displayed) Labs Reviewed  URINE CULTURE  POCT URINALYSIS DIPSTICK, ED / UC  POC URINE PREG, ED    EKG   Radiology No results found.  Procedures Procedures (including critical care time)  Medications Ordered in UC Medications - No data to display  Initial Impression / Assessment and Plan / UC Course  I have reviewed the triage vital signs and the nursing notes.  Pertinent labs & imaging results that were available during my care of the patient were reviewed by me and considered in my medical decision making (see chart for details).     20 yo female presenting with nausea, foul smelling urine, frequency, suprapubic pressure, and low back pain for the past two days. Patient is concerned for UTI. UA unremarkable. Urine pregnancy is negative. Physical exam unremarkable. Will send out urine cultures and treat with antibiotics only if they are positive. Advised to maintain adequate hydration and follow up if symptoms not improving. Additionally, patient has had palpitations recently and scheduled to see cardiology later this month. She denies any new symptoms at this time so no further evaluation regarding this was pursued during the visit.   Today's evaluation has revealed no signs of a dangerous process. Discussed diagnosis with patient and/or guardian. Patient and/or guardian aware of their diagnosis, possible red flag  symptoms to watch out for and need for close follow up. Patient and/or guardian understands verbal and written discharge instructions. Patient and/or guardian comfortable with plan and disposition.  Patient and/or guardian has a clear mental status at this time, good insight into illness (after discussion and teaching) and has clear judgment to make decisions regarding their care  This care was provided during an unprecedented National Emergency due to the Novel Coronavirus (COVID-19) pandemic. COVID-19 infections and transmission risks place heavy strains on healthcare resources.  As this pandemic evolves, our facility, providers, and staff strive to respond fluidly, to remain operational, and to provide care relative to available resources and information. Outcomes are unpredictable and treatments are without well-defined guidelines. Further, the impact of COVID-19 on all aspects of urgent care, including the impact to patients seeking care for reasons other than COVID-19, is unavoidable during this national emergency. At this time of the global pandemic, management of patients has significantly changed, even for non-COVID positive patients given high local and regional COVID volumes at this time requiring high healthcare system and resource utilization. The standard of care for management of both COVID suspected and non-COVID suspected patients continues to change rapidly at the local, regional, national, and global levels. This patient was worked up and treated to the best available but ever changing evidence and resources available at this current time.   Documentation was completed with the aid of voice recognition software. Transcription may contain typographical errors. Final Clinical Impressions(s) / UC Diagnoses   Final diagnoses:  Acute low back pain without sciatica, unspecified back pain laterality  Urinary frequency  Palpitations     Discharge Instructions      Your urine test did not  show any indication of infection.  I am going to send her urine off her cultures to  see if it grows back any bacteria.  The cultures will take several days.  We will start you on an antibiotic only if the cultures show infection.  Drink plenty of fluids to make sure your urine is clear and yellow.  Follow-up with your cardiologist as scheduled for your palpitations.      ED Prescriptions   None    PDMP not reviewed this encounter.   Lurline Idol, [place] 07/20/21 [phone]

## 2021-07-20 NOTE — ED Triage Notes (Signed)
Pt states her HR has been elevated lately. States she has a cardiologist appointment this week.

## 2021-07-20 NOTE — Discharge Instructions (Addendum)
Your urine test did not show any indication of infection.  I am going to send her urine off her cultures to see if it grows back any bacteria.  The cultures will take several days.  We will start you on an antibiotic only if the cultures show infection.  Drink plenty of fluids to make sure your urine is clear and yellow.  Follow-up with your cardiologist as scheduled for your palpitations.

## 2021-07-20 NOTE — ED Triage Notes (Signed)
Pt presents with c/o lower back pain x 48 hours.   Pt reports having a UTI before. States she took an at home UTI test and states it was positive.

## 2021-07-21 ENCOUNTER — Encounter (HOSPITAL_COMMUNITY): Payer: Self-pay

## 2021-07-21 ENCOUNTER — Ambulatory Visit (HOSPITAL_COMMUNITY)
Admission: RE | Admit: 2021-07-21 | Discharge: 2021-07-21 | Disposition: A | Discharge status: Home / Self Care | Payer: Medicaid Other | Source: Ambulatory Visit | Attending: Family Medicine | Admitting: Family Medicine

## 2021-07-21 VITALS — BP 129/100 | HR 106 | Temp 98.5°F | Resp 18

## 2021-07-21 DIAGNOSIS — N390 Urinary tract infection, site not specified: Secondary | ICD-10-CM

## 2021-07-21 DIAGNOSIS — R109 Unspecified abdominal pain: Secondary | ICD-10-CM | POA: Diagnosis not present

## 2021-07-21 LAB — POCT URINALYSIS DIPSTICK, ED / UC
Bilirubin Urine: NEGATIVE
Glucose, UA: NEGATIVE mg/dL
Hgb urine dipstick: NEGATIVE
Ketones, ur: NEGATIVE mg/dL
Nitrite: NEGATIVE
Protein, ur: NEGATIVE mg/dL
Specific Gravity, Urine: <=1.005 (ref 1.005–1.030)
Urobilinogen, UA: 0.2 mg/dL (ref 0.0–1.0)
pH: 5 (ref 5.0–8.0)

## 2021-07-21 LAB — URINE CULTURE

## 2021-07-21 MED ORDER — SULFAMETHOXAZOLE-TRIMETHOPRIM 800-160 MG PO TABS: 1.0000 | ORAL_TABLET | Freq: Two times a day (BID) | ORAL | 0 refills | Status: DC

## 2021-07-21 NOTE — ED Provider Notes (Signed)
[facility]    CSN: [id number] Arrival date & time: 07/21/21  1726      History   Chief Complaint Chief Complaint  Patient presents with   Abdominal Pain   Back Pain    HPI Colleen Maxwell is a 20 y.o. female.   Patient presenting today following up on right flank pain that she was seen for yesterday.  She states the pain has improved slightly but still has some pressure on the right lower portion of her abdomen and the right mid back and she noticed that on her MyChart results her urine culture came back positive for multiple species of bacteria.  She wanted to follow-up on this today so she came back in.  She denies fever, chills, nausea, vomiting, significant dysuria, pelvic pain, vaginal symptoms.  She has not been trying anything over-the-counter for symptoms thus far.   Past Medical History:  Diagnosis Date   Asthma    Hydradenitis     Patient Active Problem List   Diagnosis Date Noted   Lumbar radiculopathy 03/29/2021   Pre-diabetes 10/12/2018   Abnormal uterine bleeding 07/23/2018   Sleep disturbance 08/21/2017   BMI (body mass index), pediatric, > 99% for age 21/14/2018   Mild intermittent asthma with acute exacerbation 09/14/2015   Perennial allergic rhinitis 09/14/2015    Past Surgical History:  Procedure Laterality Date   NO PAST SURGERIES      OB History   No obstetric history on file.      Home Medications    Prior to Admission medications   Medication Sig Start Date End Date Taking? Authorizing Provider  sulfamethoxazole-trimethoprim (BACTRIM DS) 800-160 MG tablet Take 1 tablet by mouth 2 (two) times daily. 07/21/21  Yes Particia Nearing, PA-C  albuterol (PROVENTIL HFA;VENTOLIN HFA) 108 (90 BASE) MCG/ACT inhaler Inhale 2 puffs into the lungs every 4 (four) hours as needed for wheezing or shortness of breath.     [provider]  clindamycin (CLEOCIN) 150 MG capsule Take 1 capsule (150 mg total) by mouth every 6 (six)  hours. 03/08/21   Fayrene Helper, PA-C  cyclobenzaprine (FLEXERIL) 10 MG tablet Take 1 tablet (10 mg total) by mouth 3 (three) times daily as needed for muscle spasms. 03/25/21   Molpus, John, MD  fluconazole (DIFLUCAN) 150 MG tablet Take 1 tablet (150 mg total) by mouth daily. Take 1 tablet as soon as you fill the prescription, take a second tablet in 7 days if you continue to have symptoms. 06/09/21   Prosperi, Christian H, PA-C  lidocaine (XYLOCAINE) 2 % solution Use as directed 15 mLs in the mouth or throat as needed for mouth pain. 03/06/21   Valinda Hoar, NP  metoCLOPramide (REGLAN) 10 MG tablet Take 1 tablet (10 mg total) by mouth every 6 (six) hours as needed for nausea or vomiting. 12/26/20   Renne Crigler, PA-C  naproxen (NAPROSYN) 375 MG tablet Take 1 tablet twice daily for back pain. 03/25/21   Molpus, John, MD  pantoprazole (PROTONIX) 20 MG tablet Take 2 tablets (40 mg total) by mouth daily for 7 days. 07/12/18 07/19/18  Alvira Monday, MD  predniSONE (DELTASONE) 20 MG tablet Take 2 tablets (40 mg total) by mouth daily. 03/06/21   Valinda Hoar, NP    Family History Family History  Problem Relation Age of Onset   Healthy Mother    Healthy Father    Healthy Sister    Healthy Brother    Healthy Brother  Social History Social History   Tobacco Use   Smoking status: Never    Passive exposure: Yes   Smokeless tobacco: Never  Vaping Use   Vaping Use: Never used  Substance Use Topics   Alcohol use: No   Drug use: No     Allergies   Patient has no known allergies.   Review of Systems Review of Systems Per HPI  Physical Exam Triage Vital Signs ED Triage Vitals  Enc Vitals Group     BP 07/21/21 1817 (!) 129/100     Pulse Rate 07/21/21 1817 (!) 106     Resp 07/21/21 1817 18     Temp 07/21/21 1820 98.5 F (36.9 C)     Temp Source 07/21/21 1820 Temporal     SpO2 07/21/21 1817 100 %     Weight --      Height --      Head Circumference --      Peak Flow --       Pain Score 07/21/21 1819 2     Pain Loc --      Pain Edu? --      Excl. in GC? --    No data found.  Updated Vital Signs BP (!) 129/100   Pulse (!) 106   Temp 98.5 F (36.9 C) (Temporal)   Resp 18   LMP 07/14/2021 (Exact Date)   SpO2 100%   Visual Acuity Right Eye Distance:   Left Eye Distance:   Bilateral Distance:    Right Eye Near:   Left Eye Near:    Bilateral Near:     Physical Exam Vitals and nursing note reviewed.  Constitutional:      Appearance: Normal appearance. She is not ill-appearing.  HENT:     Head: Atraumatic.  Eyes:     Extraocular Movements: Extraocular movements intact.     Conjunctiva/sclera: Conjunctivae normal.  Cardiovascular:     Rate and Rhythm: Normal rate and regular rhythm.     Heart sounds: Normal heart sounds.  Pulmonary:     Effort: Pulmonary effort is normal.     Breath sounds: Normal breath sounds.  Abdominal:     General: Bowel sounds are normal. There is no distension.     Palpations: Abdomen is soft.     Tenderness: There is abdominal tenderness. There is right CVA tenderness. There is no guarding.     Comments: Minimal right lower quadrant and right CVA tenderness to palpation without guarding or distention  Musculoskeletal:        General: Normal range of motion.     Cervical back: Normal range of motion and neck supple.  Skin:    General: Skin is warm and dry.  Neurological:     Mental Status: She is alert and oriented to person, place, and time.  Psychiatric:        Mood and Affect: Mood normal.        Thought Content: Thought content normal.        Judgment: Judgment normal.     UC Treatments / Results  Labs (all labs ordered are listed, but only abnormal results are displayed) Labs Reviewed  POCT URINALYSIS DIPSTICK, ED / UC - Abnormal; Notable for the following components:      Result Value   Leukocytes,Ua TRACE (*)    All other components within normal limits  URINE CULTURE    EKG   Radiology No  results found.  Procedures Procedures (including critical care time)  Medications Ordered in UC Medications - No data to display  Initial Impression / Assessment and Plan / UC Course  I have reviewed the triage vital signs and the nursing notes.  Pertinent labs & imaging results that were available during my care of the patient were reviewed by me and considered in my medical decision making (see chart for details).  Given her positive urine culture, will recollect specimen today for retest.  Discussed clean-catch protocol .  We will treat for urinary tract infection with Bactrim while awaiting repeat results.  Discussed supportive home care and return precautions.  Final Clinical Impressions(s) / UC Diagnoses   Final diagnoses:  Acute lower UTI  Right flank pain   Discharge Instructions   None    ED Prescriptions     Medication Sig Dispense Auth. Provider   sulfamethoxazole-trimethoprim (BACTRIM DS) 800-160 MG tablet Take 1 tablet by mouth 2 (two) times daily. 10 tablet Particia Nearing, [place]      PDMP not reviewed this encounter.   Particia Nearing, [place] 07/21/21 [phone]

## 2021-07-21 NOTE — ED Triage Notes (Signed)
Pt is present toady with lower back pain and lower abdominal. Pt states abdominal pain started one week ago.

## 2021-07-22 LAB — URINE CULTURE

## 2021-09-18 ENCOUNTER — Emergency Department (HOSPITAL_COMMUNITY)
Admission: EM | Admit: 2021-09-18 | Discharge: 2021-09-18 | Disposition: A | Discharge status: Home / Self Care | Payer: Medicaid Other | Attending: Emergency Medicine | Admitting: Emergency Medicine

## 2021-09-18 DIAGNOSIS — J029 Acute pharyngitis, unspecified: Secondary | ICD-10-CM | POA: Insufficient documentation

## 2021-09-18 DIAGNOSIS — J45909 Unspecified asthma, uncomplicated: Secondary | ICD-10-CM | POA: Diagnosis not present

## 2021-09-18 DIAGNOSIS — G8918 Other acute postprocedural pain: Secondary | ICD-10-CM | POA: Insufficient documentation

## 2021-09-18 MED ORDER — LIDOCAINE VISCOUS HCL 2 % MT SOLN
15.0000 mL | Freq: Once | OROMUCOSAL | Status: AC
  Administered 2021-09-18: 15 mL via OROMUCOSAL
  Filled 2021-09-18: qty 15

## 2021-09-18 MED ORDER — ACETAMINOPHEN 160 MG/5ML PO SOLN
650.0000 mg | Freq: Once | ORAL | Status: AC
  Administered 2021-09-18: 650 mg via ORAL
  Filled 2021-09-18: qty 20.3

## 2021-09-18 NOTE — ED Triage Notes (Signed)
Patient arrives POV with mom, patient 5 days post-op tonsillectomy, reports increasing in pain, with no improvement using pain medications rx. No bleeding from surgical site.

## 2021-09-18 NOTE — ED Provider Notes (Signed)
Emergency Department Provider Note   I have reviewed the triage vital signs and the nursing notes.   HISTORY  Chief Complaint Post-op Problem   HPI Colleen Maxwell is a 21 y.o. female with PMH and asthma with recent tonsillectomy in the [facility] system of 12/12 w/ Dr. Farrel Gordon presents to the ED sore throat.  Patient tells me that her pain is intermittently severe and she has been taking her oxycodone.  She states when she takes her oxycodone her pain does improve significantly although she is very drowsy with this medication.  She is not having fevers.  She is able to swallow liquids.  She has not had any bleeding. No CP or SOB.    Past Medical History:  Diagnosis Date   Asthma    Hydradenitis     Review of Systems  Constitutional: No fever/chills ENT: Positive sore throat. Cardiovascular: Denies chest pain. Respiratory: Denies shortness of breath. Gastrointestinal: No abdominal pain.  No nausea, no vomiting.   Skin: Negative for rash. Neurological: Negative for headaches.   ____________________________________________   PHYSICAL EXAM:  VITAL SIGNS: ED Triage Vitals [09/18/21 0547]  Enc Vitals Group     BP 104/82     Pulse Rate (!) 127     Resp 20     Temp 99.5 F (37.5 C)     Temp Source Oral     SpO2 100 %   Constitutional: Alert and oriented. Well appearing and in no acute distress. Eyes: Conjunctivae are normal.  Head: Atraumatic. Nose: No congestion/rhinnorhea. Mouth/Throat: Mucous membranes are moist.  Oropharynx non-erythematous. White scarring over both tonsils. No bleeding, edema, or obvious abscess.  Neck: No stridor.  Cardiovascular: Good peripheral circulation. Respiratory: Normal respiratory effort.  Gastrointestinal: No distention.  Musculoskeletal: No gross deformities of extremities. Neurologic:  Awake and alert. Moving all extremities.  Skin:  Skin is warm, dry and intact. No rash  noted.   ____________________________________________   PROCEDURES  Procedure(s) performed:   Procedures  None  ____________________________________________   INITIAL IMPRESSION / ASSESSMENT AND PLAN / ED COURSE  Pertinent labs & imaging results that were available during my care of the patient were reviewed by me and considered in my medical decision making (see chart for details).   This patient is Presenting for Evaluation of post-op sore throat, which does require a range of treatment options, and is a complaint that involves a high risk of morbidity and mortality.  The Differential Diagnoses include post-op hematoma, PTA, retropharyngeal abscess, angioedema.   Critical Interventions- assessment at the bedside with PO pain medications.    Reassessment after intervention: Awake, alert, and more comfortable.   I decided to review pertinent External Data, and in summary patient with tonsillectomy on 12/12 in the [facility] system reviewed in [place]. .   Clinical Laboratory Tests: Considered lab testing including CBC and chemistry however patient with no subjective or objective signs to suspect bleeding/anemia or acute infectious process.   Radiologic Tests: Considered advanced imaging of the neck to assess the postoperative site.  On exam, however, patient is awake and alert.  She has a widely patent oropharynx with scarring over both tonsils which I would expect in the postoperative setting.  There is no sign of respiratory distress.  Patient is tolerating oral secretions. Defer emergent imaging for now.    Social Determinants of Health Risk family arrives with patient in the ED.   Medical Decision Making: Summary:  Patient arrives with sore throat and pain  after tonsillectomy.  She is managing her secretions and appears well.  Vital signs within normal limit.  Her main complaint about her pain medication as it does cause drowsiness which she has a known side effect  of Percocet.  She does not appear to have any acute postoperative complications on my bedside exam.  Have encouraged her to follow closely with her ENT doctors at [facility] [place].  She will call the office today to schedule a close follow-up appointment.  She will continue fluids and pain medications as prescribed.  Do not see indication for emergent neck imaging as described above.   Disposition: discharge  ____________________________________________  FINAL CLINICAL IMPRESSION(S) / ED DIAGNOSES  Final diagnoses:  Post-operative pain     MEDICATIONS GIVEN DURING THIS VISIT:  Medications  lidocaine (XYLOCAINE) 2 % viscous mouth solution 15 mL (15 mLs Mouth/Throat Given 09/18/21 0626)  acetaminophen (TYLENOL) 160 MG/5ML solution 650 mg (650 mg Oral Given 09/18/21 0626)    Note:  This document was prepared using Dragon voice recognition software and may include unintentional dictation errors.  Nanda Quinton, MD, [facility]    Collyn Selk, Wonda Olds, MD 09/18/21 1045

## 2021-09-18 NOTE — ED Provider Triage Note (Signed)
Emergency Medicine Provider Triage Evaluation Note  Colleen Maxwell , a 21 y.o. female  was evaluated in triage.  Pt presents with her family member for evaluation of post op pain following tonsillectomy 09/13/21. Pain progressively worsening, taking motrin/tylenol without relief. She was prescribed liquid narcotics which she has not taken for > 24 hours because she does not like the way it makes her feel. History provided by family member and patient nodding yes/no answers. She has not had fever, vomiting, or dyspnea.   Review of Systems  Positive: Sore throat Negative: Fever, vomiting, dyspnea.   Physical Exam  BP 104/82 (BP Location: Right Arm)    Pulse (!) 127    Temp 99.5 F (37.5 C) (Oral)    Resp 20    SpO2 100%  Gen:   Awake, no distress   Resp:  Normal effort  MSK:   Moves extremities without difficulty  Other:  Posterior oropharyngeal changes consistent w/ recent tonsillectomy. No active bleeding. Tolerating own secretions without difficulty. Airway is patent.   Medical Decision Making  Medically screening exam initiated at 5:50 AM.  Appropriate orders placed.  Colleen Maxwell was informed that the remainder of the evaluation will be completed by another provider, this initial triage assessment does not replace that evaluation, and the importance of remaining in the ED until their evaluation is complete.  Sore throat, post op pain.  Will trial viscous lidocaine, holding off on narcotics at this time as patient feels she has not been tolerating them well @ home.    Cherly Anderson, [place] 09/18/21 [phone]

## 2021-09-18 NOTE — Discharge Instructions (Signed)
You are seen in the emergency room today with postoperative pain after tonsillectomy.  Your exam here is what I would expect after tonsillectomy.  Your oxycodone may cause drowsiness but this is a known side effect of this medicine.  You may take ibuprofen or Tylenol for mild to moderate pain.  The Percocet may cause drowsiness.  You should not take this with alcohol or other strong pain medicine.  Please call your ENT doctor today to make them aware of your ED visit and postoperative pain so they can schedule a follow-up appointment for reassessment.

## 2021-12-22 ENCOUNTER — Encounter (HOSPITAL_COMMUNITY): Payer: Self-pay

## 2021-12-22 ENCOUNTER — Ambulatory Visit (HOSPITAL_COMMUNITY)
Admission: RE | Admit: 2021-12-22 | Discharge: 2021-12-22 | Disposition: A | Discharge status: Home / Self Care | Payer: Medicaid Other | Source: Ambulatory Visit | Attending: Internal Medicine | Admitting: Internal Medicine

## 2021-12-22 ENCOUNTER — Other Ambulatory Visit: Payer: Self-pay

## 2021-12-22 VITALS — BP 126/78 | HR 104 | Temp 98.7°F | Resp 20

## 2021-12-22 DIAGNOSIS — R059 Cough, unspecified: Secondary | ICD-10-CM | POA: Diagnosis not present

## 2021-12-22 DIAGNOSIS — J069 Acute upper respiratory infection, unspecified: Secondary | ICD-10-CM

## 2021-12-22 DIAGNOSIS — J029 Acute pharyngitis, unspecified: Secondary | ICD-10-CM | POA: Insufficient documentation

## 2021-12-22 DIAGNOSIS — Z20822 Contact with and (suspected) exposure to covid-19: Secondary | ICD-10-CM | POA: Diagnosis not present

## 2021-12-22 LAB — POCT RAPID STREP A, ED / UC: Streptococcus, Group A Screen (Direct): NEGATIVE

## 2021-12-22 NOTE — ED Provider Notes (Signed)
?[facility] ? ? ? ?CSN: [id number] ?Arrival date & time: 12/22/21  1049 ? ? ?  ? ?History   ?Chief Complaint ?Chief Complaint  ?Patient presents with  ? Sore Throat  ?  Entered by patient  ? ? ?HPI ?Colleen Maxwell is a 21 y.o. female.  ? ?Patient presents with 2-day history of sore throat, nasal congestion, bilateral ear pain and discomfort, cough, sneezing.  Denies any known sick contacts or fevers at home.  Denies chest pain, shortness of breath, nausea, vomiting, diarrhea, abdominal pain.  Patient has not taken any medications to help alleviate symptoms.  Patient had tonsillectomy in January 2023. ? ? ?Sore Throat ? ? ?Past Medical History:  ?Diagnosis Date  ? Asthma   ? Hydradenitis   ? ? ?Patient Active Problem List  ? Diagnosis Date Noted  ? Lumbar radiculopathy 03/29/2021  ? Pre-diabetes 10/12/2018  ? Abnormal uterine bleeding 07/23/2018  ? Sleep disturbance 08/21/2017  ? BMI (body mass index), pediatric, > 99% for age 52/14/2018  ? Mild intermittent asthma with acute exacerbation 09/14/2015  ? Perennial allergic rhinitis 09/14/2015  ? ? ?Past Surgical History:  ?Procedure Laterality Date  ? NO PAST SURGERIES    ? ? ?OB History   ?No obstetric history on file. ?  ? ? ? ?Home Medications   ? ?Prior to Admission medications   ?Medication Sig Start Date End Date Taking? Authorizing Provider  ?albuterol (PROVENTIL HFA;VENTOLIN HFA) 108 (90 BASE) MCG/ACT inhaler Inhale 2 puffs into the lungs every 4 (four) hours as needed for wheezing or shortness of breath.  ?Patient not taking: Reported on 12/22/2021    [provider]  ?clindamycin (CLEOCIN) 150 MG capsule Take 1 capsule (150 mg total) by mouth every 6 (six) hours. 03/08/21   Fayrene Helper, PA-C  ?cyclobenzaprine (FLEXERIL) 10 MG tablet Take 1 tablet (10 mg total) by mouth 3 (three) times daily as needed for muscle spasms. 03/25/21   Molpus, John, MD  ?fluconazole (DIFLUCAN) 150 MG tablet Take 1 tablet (150 mg total) by mouth daily. Take 1 tablet  as soon as you fill the prescription, take a second tablet in 7 days if you continue to have symptoms. ?Patient not taking: Reported on 12/22/2021 06/09/21   Prosperi, Christian H, PA-C  ?lidocaine (XYLOCAINE) 2 % solution Use as directed 15 mLs in the mouth or throat as needed for mouth pain. 03/06/21   Valinda Hoar, NP  ?metoCLOPramide (REGLAN) 10 MG tablet Take 1 tablet (10 mg total) by mouth every 6 (six) hours as needed for nausea or vomiting. 12/26/20   Renne Crigler, PA-C  ?naproxen (NAPROSYN) 375 MG tablet Take 1 tablet twice daily for back pain. 03/25/21   Molpus, John, MD  ?pantoprazole (PROTONIX) 20 MG tablet Take 2 tablets (40 mg total) by mouth daily for 7 days. 07/12/18 07/19/18  Alvira Monday, MD  ?predniSONE (DELTASONE) 20 MG tablet Take 2 tablets (40 mg total) by mouth daily. 03/06/21   Valinda Hoar, NP  ?sulfamethoxazole-trimethoprim (BACTRIM DS) 800-160 MG tablet Take 1 tablet by mouth 2 (two) times daily. ?Patient not taking: Reported on 12/22/2021 07/21/21   Particia Nearing, PA-C  ? ? ?Family History ?Family History  ?Problem Relation Age of Onset  ? Healthy Mother   ? Healthy Father   ? Healthy Sister   ? Healthy Brother   ? Healthy Brother   ? ? ?Social History ?Social History  ? ?Tobacco Use  ? Smoking status: Never  ?  Passive exposure: Yes  ? Smokeless tobacco: Never  ?Vaping Use  ? Vaping Use: Never used  ?Substance Use Topics  ? Alcohol use: No  ? Drug use: No  ? ? ? ?Allergies   ?Patient has no known allergies. ? ? ?Review of Systems ?Review of Systems ?Per HPI ? ?Physical Exam ?Triage Vital Signs ?ED Triage Vitals  ?Enc Vitals Group  ?   BP 12/22/21 1217 126/78  ?   Pulse Rate 12/22/21 1217 (!) 104  ?   Resp 12/22/21 1217 20  ?   Temp 12/22/21 1217 98.7 ?F (37.1 ?C)  ?   Temp Source 12/22/21 1217 Oral  ?   SpO2 12/22/21 1217 98 %  ?   Weight --   ?   Height --   ?   Head Circumference --   ?   Peak Flow --   ?   Pain Score 12/22/21 1212 3  ?   Pain Loc --   ?   Pain Edu? --    ?   Excl. in GC? --   ? ?No data found. ? ?Updated Vital Signs ?BP 126/78 (BP Location: Right Arm)   Pulse (!) 104   Temp 98.7 ?F (37.1 ?C) (Oral)   Resp 20   LMP 11/26/2021 (Approximate)   SpO2 98%  ? ?Visual Acuity ?Right Eye Distance:   ?Left Eye Distance:   ?Bilateral Distance:   ? ?Right Eye Near:   ?Left Eye Near:    ?Bilateral Near:    ? ?Physical Exam ?Constitutional:   ?   General: She is not in acute distress. ?   Appearance: Normal appearance. She is not toxic-appearing or diaphoretic.  ?HENT:  ?   Head: Normocephalic and atraumatic.  ?   Right Ear: Tympanic membrane and ear canal normal.  ?   Left Ear: Tympanic membrane and ear canal normal.  ?   Nose: Congestion present.  ?   Mouth/Throat:  ?   Mouth: Mucous membranes are moist.  ?   Pharynx: Posterior oropharyngeal erythema present.  ?   Comments: Tonsils not present. ?Eyes:  ?   Extraocular Movements: Extraocular movements intact.  ?   Conjunctiva/sclera: Conjunctivae normal.  ?   Pupils: Pupils are equal, round, and reactive to light.  ?Cardiovascular:  ?   Rate and Rhythm: Normal rate and regular rhythm.  ?   Pulses: Normal pulses.  ?   Heart sounds: Normal heart sounds.  ?Pulmonary:  ?   Effort: Pulmonary effort is normal. No respiratory distress.  ?   Breath sounds: Normal breath sounds. No stridor. No wheezing, rhonchi or rales.  ?Abdominal:  ?   General: Abdomen is flat. Bowel sounds are normal.  ?   Palpations: Abdomen is soft.  ?Musculoskeletal:     ?   General: Normal range of motion.  ?   Cervical back: Normal range of motion.  ?Skin: ?   General: Skin is warm and dry.  ?Neurological:  ?   General: No focal deficit present.  ?   Mental Status: She is alert and oriented to person, place, and time. Mental status is at baseline.  ?Psychiatric:     ?   Mood and Affect: Mood normal.     ?   Behavior: Behavior normal.  ? ? ? ?UC Treatments / Results  ?Labs ?(all labs ordered are listed, but only abnormal results are displayed) ?Labs Reviewed   ?CULTURE, GROUP A STREP [facility])  ?SARS CORONAVIRUS 2 (TAT  6-24 HRS)  ?POCT RAPID STREP A, ED / UC  ? ? ?EKG ? ? ?Radiology ?No results found. ? ?Procedures ?Procedures (including critical care time) ? ?Medications Ordered in UC ?Medications - No data to display ? ?Initial Impression / Assessment and Plan / UC Course  ?I have reviewed the triage vital signs and the nursing notes. ? ?Pertinent labs & imaging results that were available during my care of the patient were reviewed by me and considered in my medical decision making (see chart for details). ? ?  ? ?Patient presents with symptoms likely from a viral upper respiratory infection. Differential includes bacterial pneumonia, sinusitis, allergic rhinitis, COVID-19. Do not suspect underlying cardiopulmonary process. Symptoms seem unlikely related to ACS, CHF or COPD exacerbations, pneumonia, pneumothorax. Patient is nontoxic appearing and not in need of emergent medical intervention.  Rapid strep was negative.  Throat culture and COVID test pending. ? ?Recommended symptom control with over the counter medications: Daily oral anti-histamine, Oral decongestant or IN corticosteroid, saline irrigations, cepacol lozenges, Robitussin, Delsym, honey tea. ? ?Return if symptoms fail to improve in 1-2 weeks or you develop shortness of breath, chest pain, severe headache. Patient states understanding and is agreeable. ? ?Discharged with PCP followup.  ?Final Clinical Impressions(s) / UC Diagnoses  ? ?Final diagnoses:  ?Viral upper respiratory tract infection with cough  ?Sore throat  ? ? ? ?Discharge Instructions   ? ?  ?Your rapid strep test was negative.  Throat culture and COVID test are pending.  We will call if they are positive.  It appears that you have a viral upper respiratory infection that should self resolve in the next few days with symptomatic treatment.  Please follow-up if symptoms persist or worsen. ? ? ? ? ?ED Prescriptions   ?None ?  ? ?PDMP not reviewed  this encounter. ?  ?Gustavus BryantMound, Durk Carmen E, [place] ?12/22/21 1241 ? ?

## 2021-12-22 NOTE — Discharge Instructions (Signed)
Your rapid strep test was negative.  Throat culture and COVID test are pending.  We will call if they are positive.  It appears that you have a viral upper respiratory infection that should self resolve in the next few days with symptomatic treatment.  Please follow-up if symptoms persist or worsen. ?

## 2021-12-22 NOTE — ED Triage Notes (Signed)
Patient complains of a sore throat.  Sore throat started 2 days ago.  Patient complains of head congestion, bilateral ear pain, but right worse than left.  Denies running a fever ?

## 2021-12-22 NOTE — ED Notes (Signed)
Strep swab labeled , obtained and placed in lab ?

## 2021-12-23 LAB — SARS CORONAVIRUS 2 (TAT 6-24 HRS): SARS Coronavirus 2: NEGATIVE

## 2021-12-25 LAB — CULTURE, GROUP A STREP (THRC)

## 2022-07-09 ENCOUNTER — Ambulatory Visit (INDEPENDENT_AMBULATORY_CARE_PROVIDER_SITE_OTHER): Payer: Medicaid Other

## 2022-07-09 ENCOUNTER — Encounter: Payer: Self-pay | Admitting: Family Medicine

## 2022-07-09 ENCOUNTER — Ambulatory Visit (INDEPENDENT_AMBULATORY_CARE_PROVIDER_SITE_OTHER): Payer: Medicaid Other | Admitting: Family Medicine

## 2022-07-09 VITALS — BP 104/80 | HR 85 | Temp 98.8°F | Ht 70.75 in | Wt 241.3 lb

## 2022-07-09 DIAGNOSIS — R1084 Generalized abdominal pain: Secondary | ICD-10-CM | POA: Diagnosis not present

## 2022-07-09 DIAGNOSIS — M5416 Radiculopathy, lumbar region: Secondary | ICD-10-CM

## 2022-07-09 DIAGNOSIS — Z23 Encounter for immunization: Secondary | ICD-10-CM | POA: Diagnosis not present

## 2022-07-09 DIAGNOSIS — J4520 Mild intermittent asthma, uncomplicated: Secondary | ICD-10-CM

## 2022-07-09 DIAGNOSIS — R7303 Prediabetes: Secondary | ICD-10-CM | POA: Diagnosis not present

## 2022-07-09 DIAGNOSIS — J4521 Mild intermittent asthma with (acute) exacerbation: Secondary | ICD-10-CM | POA: Diagnosis not present

## 2022-07-09 DIAGNOSIS — F411 Generalized anxiety disorder: Secondary | ICD-10-CM

## 2022-07-09 LAB — COMPREHENSIVE METABOLIC PANEL
ALT: 8 U/L (ref 0–35)
AST: 17 U/L (ref 0–37)
Albumin: 4.5 g/dL (ref 3.5–5.2)
Alkaline Phosphatase: 65 U/L (ref 39–117)
BUN: 14 mg/dL (ref 6–23)
CO2: 26 mEq/L (ref 19–32)
Calcium: 9.1 mg/dL (ref 8.4–10.5)
Chloride: 107 mEq/L (ref 96–112)
Creatinine, Ser: 0.69 mg/dL (ref 0.40–1.20)
GFR: 125.22 mL/min (ref >60.00)
Glucose, Bld: 94 mg/dL (ref 70–99)
Potassium: 4.3 mEq/L (ref 3.5–5.1)
Sodium: 140 mEq/L (ref 135–145)
Total Bilirubin: 0.2 mg/dL (ref 0.2–1.2)
Total Protein: 7.4 g/dL (ref 6.0–8.3)

## 2022-07-09 LAB — CBC WITH DIFFERENTIAL/PLATELET
Basophils Absolute: 0.1 10*3/uL (ref 0.0–0.1)
Basophils Relative: 0.8 % (ref 0.0–3.0)
Eosinophils Absolute: 0 10*3/uL (ref 0.0–0.7)
Eosinophils Relative: 0.6 % (ref 0.0–5.0)
HCT: 39.7 % (ref 36.0–46.0)
Hemoglobin: 13.2 g/dL (ref 12.0–15.0)
Lymphocytes Relative: 28 % (ref 12.0–46.0)
Lymphs Abs: 1.9 10*3/uL (ref 0.7–4.0)
MCHC: 33.2 g/dL (ref 30.0–36.0)
MCV: 88.4 fl (ref 78.0–100.0)
Monocytes Absolute: 0.5 10*3/uL (ref 0.1–1.0)
Monocytes Relative: 7.5 % (ref 3.0–12.0)
Neutro Abs: 4.3 10*3/uL (ref 1.4–7.7)
Neutrophils Relative %: 63.1 % (ref 43.0–77.0)
Platelets: 285 10*3/uL (ref 150.0–400.0)
RBC: 4.49 Mil/uL (ref 3.87–5.11)
RDW: 13.2 % (ref 11.5–14.6)
WBC: 6.9 10*3/uL (ref 4.5–10.5)

## 2022-07-09 LAB — HEMOGLOBIN A1C: Hgb A1c MFr Bld: 5.8 % (ref 4.6–6.5)

## 2022-07-09 LAB — TSH: TSH: 0.93 u[IU]/mL (ref 0.35–5.50)

## 2022-07-09 MED ORDER — CITALOPRAM HYDROBROMIDE 20 MG PO TABS: 20.0000 mg | ORAL_TABLET | Freq: Every day | ORAL | 5 refills | Status: DC

## 2022-07-09 MED ORDER — NAPROXEN 375 MG PO TABS: ORAL_TABLET | ORAL | 2 refills | Status: DC

## 2022-07-09 MED ORDER — CYCLOBENZAPRINE HCL 10 MG PO TABS: 10.0000 mg | ORAL_TABLET | Freq: Three times a day (TID) | ORAL | 2 refills | Status: DC | PRN

## 2022-07-09 MED ORDER — ALBUTEROL SULFATE HFA 108 (90 BASE) MCG/ACT IN AERS: 2.0000 | INHALATION_SPRAY | RESPIRATORY_TRACT | 11 refills | Status: DC | PRN
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
## 2022-07-09 NOTE — Progress Notes (Signed)
All labs are normal at this time. Her A1C is slightly elevated indicating a mild amount of prediabetes. I recommend reducing sugar and starches in the diet to help correct this problem.
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
## 2022-07-10 DIAGNOSIS — R1084 Generalized abdominal pain: Secondary | ICD-10-CM | POA: Insufficient documentation

## 2022-07-10 DIAGNOSIS — F411 Generalized anxiety disorder: Secondary | ICD-10-CM | POA: Insufficient documentation

## 2022-07-10 NOTE — Assessment & Plan Note (Signed)
Will check all new set of bloodwork including lipid panel, CMP, TSH and A1C.

## 2022-07-10 NOTE — Assessment & Plan Note (Signed)
Will treat with naproxen 375 mg BID, cyclobenzaprine 10 mg TID PRN. I will also order lumbar spine films to start her work up.

## 2022-07-10 NOTE — Assessment & Plan Note (Signed)
Lungs clear on exam, will continue with PRN albuterol, 2 puffs every 4-6 hours PRN.

## 2022-07-10 NOTE — Assessment & Plan Note (Signed)
We had a discussion about medications, I recommended starting citalopram 20 mg daily at bedtime. I will follow up with her in 6 weeks via video visit to reassess her symptoms.

## 2022-07-10 NOTE — Assessment & Plan Note (Signed)
Unclear etiology, could be celiac disease since her sx improved with gluten free diet, will order celiac panel.

## 2022-07-11 LAB — CELIAC PANEL 10
Antigliadin Abs, IgA: 5 units (ref 0–19)
Endomysial IgA: NEGATIVE
Gliadin IgG: 3 units (ref 0–19)
IgA/Immunoglobulin A, Serum: 201 mg/dL (ref 87–352)
Tissue Transglut Ab: 4 U/mL (ref 0–5)
Transglutaminase IgA: <2 U/mL (ref 0–3)

## 2022-07-11 NOTE — Progress Notes (Signed)
Her celiac panel is negative-- does she already have a GI doctor? If not I can refer her.Marland KitchenMarland Kitchen

## 2022-07-11 NOTE — Progress Notes (Signed)
Normal lumbar x-ray. If the pain continues then I recommend referral to sports medicine or possibly physical therapy.

## 2022-08-20 ENCOUNTER — Encounter: Payer: Self-pay | Admitting: Family Medicine

## 2022-08-20 ENCOUNTER — Ambulatory Visit (INDEPENDENT_AMBULATORY_CARE_PROVIDER_SITE_OTHER): Payer: Medicaid Other | Admitting: Family Medicine

## 2022-08-20 VITALS — BP 100/68 | HR 82 | Temp 98.7°F | Ht 70.75 in | Wt 241.3 lb

## 2022-08-20 DIAGNOSIS — F411 Generalized anxiety disorder: Secondary | ICD-10-CM

## 2022-08-20 DIAGNOSIS — M5416 Radiculopathy, lumbar region: Secondary | ICD-10-CM

## 2022-08-20 MED ORDER — ESCITALOPRAM OXALATE 10 MG PO TABS: 10.0000 mg | ORAL_TABLET | Freq: Every day | ORAL | 1 refills | Status: DC

## 2022-08-20 MED ORDER — CYCLOBENZAPRINE HCL 10 MG PO TABS: 10.0000 mg | ORAL_TABLET | Freq: Three times a day (TID) | ORAL | 2 refills | Status: DC | PRN

## 2022-08-20 NOTE — Patient Instructions (Signed)
Memory foam cushioning and arch support for shoes

## 2022-08-20 NOTE — Progress Notes (Unsigned)
Established Patient Office Visit  Subjective   Patient ID: Colleen Maxwell, female    DOB: [birth year]  Age: 21 y.o. MRN: [id number]  Chief Complaint  Patient presents with   Follow-up    Patient complains of recurrent back pain, taking Naproxen with relief and states she did not take Rx for Flexeril as the pharmacy did not have the prescription    Patient is here for follow up on her back pain, states that she noticed that the longer she works the worse her back pain is, states that she is on her feet most of her shifts at work and when she wakes up the next day she has a lot of soreness in her feet and legs. States that she does get improvement of her pain with rest and with OTC ibuprofen 800 mg. 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 We discuss other ways to help improve the pain including getting cushioned/supportive shoes and stretching, heating pads, muscle rubs, etc. Pt reports she has use muscle relaxers in the past with some improvement also.   Patient reports that the citalopram is making her very tired, states that she feels groggy when she wakes up in the morning  and then after her shifts at work she is still very tired. States that it has helped with her anxiety and is managing her symptoms well. We discussed switching her to lexapro to help reduce drowsiness and she is agreeable.   Current Outpatient Medications  Medication Instructions   albuterol (VENTOLIN HFA) 108 (90 Base) MCG/ACT inhaler 2 puffs, Inhalation, Every 4 hours PRN   cyclobenzaprine (FLEXERIL) 10 mg, Oral, 3 times daily PRN   escitalopram (LEXAPRO) 10 mg, Oral, Daily   naproxen (NAPROSYN) 375 MG tablet Take 1 tablet twice daily for back pain.     Patient Active Problem List   Diagnosis Date Noted   Generalized abdominal pain 07/10/2022   Generalized anxiety disorder 07/10/2022   Lumbar radiculopathy 03/29/2021   Pre-diabetes 10/12/2018    Abnormal uterine bleeding 07/23/2018   Sleep disturbance 08/21/2017   BMI (body mass index), pediatric, > 99% for age 54/14/2018   Mild intermittent asthma with acute exacerbation 09/14/2015   Perennial allergic rhinitis 09/14/2015      Review of Systems  All other systems reviewed and are negative.     Objective:     BP 100/68 (BP Location: Left Arm, Patient Position: Sitting, Cuff Size: Large)   Pulse 82   Temp 98.7 F (37.1 C) (Oral)   Ht 5' 10.75" (1.797 m)   Wt 241 lb 4.8 oz (109.5 kg)   LMP 08/03/2022   SpO2 96%   BMI 33.89 kg/m    Physical Exam Vitals reviewed.  Constitutional:      Appearance: Normal appearance. She is well-groomed and normal weight.  Eyes:     Conjunctiva/sclera: Conjunctivae normal.  Cardiovascular:     Rate and Rhythm: Normal rate and regular rhythm.     Pulses: Normal pulses.     Heart sounds: S1 normal and S2 normal.  Pulmonary:     Effort: Pulmonary effort is normal.     Breath sounds: Normal breath sounds and air entry.  Neurological:     Mental Status: She is alert and oriented to person, place, and time. Mental status is at baseline.     Gait: Gait is intact.  Psychiatric:        Mood and Affect: Mood  and affect normal.        Speech: Speech normal.        Behavior: Behavior normal.        Judgment: Judgment normal.      No results found for any visits on 08/20/22.    The ASCVD Risk score (Arnett DK, et al., 2019) failed to calculate for the following reasons:   The 2019 ASCVD risk score is only valid for ages 50 to 48    Assessment & Plan:   Problem List Items Addressed This Visit       Unprioritized   Lumbar radiculopathy    Pt's symptoms are somewhat controlled with the cyclobenzaprine and PRN ibuprofen, recommended getting supportive shoes and taking breaks. Continue the use of other supportive measures at home including heating pads, muscle rubs, stretching and she may also try using lumbar support braces as  well while working.       Relevant Medications   cyclobenzaprine (FLEXERIL) 10 MG tablet   escitalopram (LEXAPRO) 10 MG tablet   Generalized anxiety disorder - Primary    Good control of her anxiety with citalopram however she is reporting increasing drowsiness with the medication, will switch her to lexapro 10 mg daily to hopefully reduce the drowsiness. RTC in 6 months for her annual exam. Pt will let me know if she has any problems with the lexapro.      Relevant Medications   escitalopram (LEXAPRO) 10 MG tablet    Return in about 6 months (around 02/19/2023) for Annual physical exam.    Karie Georges, MD

## 2022-08-22 NOTE — Assessment & Plan Note (Addendum)
Good control of her anxiety with citalopram however she is reporting increasing drowsiness with the medication, will switch her to lexapro 10 mg daily to hopefully reduce the drowsiness. RTC in 6 months for her annual exam. Pt will let me know if she has any problems with the lexapro.

## 2022-08-22 NOTE — Assessment & Plan Note (Signed)
Pt's symptoms are somewhat controlled with the cyclobenzaprine and PRN ibuprofen, recommended getting supportive shoes and taking breaks. Continue the use of other supportive measures at home including heating pads, muscle rubs, stretching and she may also try using lumbar support braces as well while working.

## 2022-10-23 ENCOUNTER — Encounter: Payer: Self-pay | Admitting: Family Medicine

## 2022-10-23 DIAGNOSIS — T50905A Adverse effect of unspecified drugs, medicaments and biological substances, initial encounter: Secondary | ICD-10-CM

## 2022-10-23 DIAGNOSIS — F411 Generalized anxiety disorder: Secondary | ICD-10-CM

## 2022-10-24 ENCOUNTER — Encounter (HOSPITAL_COMMUNITY): Payer: Self-pay

## 2022-10-24 ENCOUNTER — Ambulatory Visit (HOSPITAL_COMMUNITY)
Admission: RE | Admit: 2022-10-24 | Discharge: 2022-10-24 | Disposition: A | Discharge status: Home / Self Care | Payer: Medicaid Other | Source: Ambulatory Visit | Attending: Internal Medicine | Admitting: Internal Medicine

## 2022-10-24 ENCOUNTER — Other Ambulatory Visit: Payer: Self-pay

## 2022-10-24 VITALS — BP 115/78 | HR 85 | Temp 98.1°F | Resp 18

## 2022-10-24 DIAGNOSIS — R1030 Lower abdominal pain, unspecified: Secondary | ICD-10-CM | POA: Insufficient documentation

## 2022-10-24 DIAGNOSIS — Z202 Contact with and (suspected) exposure to infections with a predominantly sexual mode of transmission: Secondary | ICD-10-CM | POA: Diagnosis present

## 2022-10-24 DIAGNOSIS — R110 Nausea: Secondary | ICD-10-CM

## 2022-10-24 DIAGNOSIS — R112 Nausea with vomiting, unspecified: Secondary | ICD-10-CM | POA: Insufficient documentation

## 2022-10-24 DIAGNOSIS — R197 Diarrhea, unspecified: Secondary | ICD-10-CM | POA: Diagnosis present

## 2022-10-24 DIAGNOSIS — Z3202 Encounter for pregnancy test, result negative: Secondary | ICD-10-CM

## 2022-10-24 LAB — POCT URINALYSIS DIPSTICK, ED / UC
Bilirubin Urine: NEGATIVE
Glucose, UA: NEGATIVE mg/dL
Hgb urine dipstick: NEGATIVE
Ketones, ur: NEGATIVE mg/dL
Leukocytes,Ua: NEGATIVE
Nitrite: NEGATIVE
Protein, ur: NEGATIVE mg/dL
Specific Gravity, Urine: 1.02 (ref 1.005–1.030)
Urobilinogen, UA: 0.2 mg/dL (ref 0.0–1.0)
pH: 7 (ref 5.0–8.0)

## 2022-10-24 LAB — POC URINE PREG, ED: Preg Test, Ur: NEGATIVE

## 2022-10-24 LAB — HIV ANTIBODY (ROUTINE TESTING W REFLEX): HIV Screen 4th Generation wRfx: NONREACTIVE

## 2022-10-24 MED ORDER — METFORMIN HCL 500 MG PO TABS: 500.0000 mg | ORAL_TABLET | Freq: Two times a day (BID) | ORAL | 3 refills | Status: DC

## 2022-10-24 MED ORDER — ONDANSETRON 4 MG PO TBDP: 4.0000 mg | ORAL_TABLET | Freq: Three times a day (TID) | ORAL | 0 refills | Status: DC | PRN

## 2022-10-24 MED ORDER — ONDANSETRON 4 MG PO TBDP
ORAL_TABLET | ORAL | Status: AC
  Filled 2022-10-24: qty 1

## 2022-10-24 MED ORDER — ONDANSETRON 4 MG PO TBDP
4.0000 mg | ORAL_TABLET | Freq: Once | ORAL | Status: AC
  Administered 2022-10-24: 4 mg via ORAL

## 2022-10-24 NOTE — Discharge Instructions (Signed)
Your evaluation suggests that your symptoms are most likely due to viral stomach illness (gastroenteritis) which will improve on its own with rest and fluids in the next few days.   I have prescribed an antinausea medication for you to take at home called Zofran. It is the same medication that we gave you in the office.  You may use tylenol 1,083m every 6 hours over the counter as needed for abdominal discomfort related to this virus.   Your urine is negative for UTI and your pregnancy test is negative.   Eat a bland diet for the next 12-24 hours (bananas, rice, white toast, and applesauce) once you are able to tolerate liquids (broth, etc). These foods are easy for your stomach to digest. Pedialyte can be purchased to help with rehydration. Drink plenty of water.   STD testing is pending and will come back in the next 2-3 days, will treat you at that time for any STDs that you have.   Please follow up with your primary care provider for further management. Return if you experience worsening or uncontrolled pain, inability to tolerate fluids by mouth, difficulty breathing, fevers 100.1F or greater, recurrent vomiting, or any other concerning symptoms. I hope you feel better!

## 2022-10-24 NOTE — ED Provider Notes (Addendum)
[place]    CSN: [id number] Arrival date & time: 10/24/22  1337      History   Chief Complaint Chief Complaint  Patient presents with   Influenza   Appointment    13:30    HPI Colleen Maxwell is a 22 y.o. female.   Patient presents to urgent care for evaluation of generalized abdominal pain starting approximately 1 week ago as well as diarrhea and nausea over the last 2 to 3 days.  She states the nausea has been persistent and she "wishes she could throw up".  Eating and drinking normally despite nausea.  Patient also mentions headache, sore throat, and fatigue for the last 2 to 3 days.  She has not had any fever or chills.  Last episode of diarrhea was today.  No blood/mucus in the stools.  She is sexually active and reports new sexual partner.  Denies recent vaginal symptoms or urinary symptoms, however does report worsening abdominal pain to the lower bilateral abdomen.  She has not had any episodes of emesis.  She has not taken any medications for her symptoms before coming to urgent care.  She states that she did recently eat a grilled cheese sandwich from her place of work and afterwards noticed that there was mold on the cheese.  She believes that she did ingest moldy cheese but is unsure.  No other recent changes in diet or recent antibiotic/steroid use.  Denies generalized bodyaches, back pain, vision changes, cough, or dizziness.  She cannot identify any triggering or relieving factors for her abdominal pain.  Currently a 5 on a scale of 0-10 and described as a cramping sensation.   Influenza   Past Medical History:  Diagnosis Date   Anxiety    Asthma    Hidradenitis suppurativa    History of pericarditis    Hydradenitis    Sciatica     Patient Active Problem List   Diagnosis Date Noted   Generalized abdominal pain 07/10/2022   Generalized anxiety disorder 07/10/2022   Lumbar radiculopathy 03/29/2021   Pre-diabetes 10/12/2018   Abnormal  uterine bleeding 07/23/2018   Sleep disturbance 08/21/2017   BMI (body mass index), pediatric, > 99% for age 34/14/2018   Mild intermittent asthma with acute exacerbation 09/14/2015   Perennial allergic rhinitis 09/14/2015    Past Surgical History:  Procedure Laterality Date   NO PAST SURGERIES     TONSILLECTOMY      OB History   No obstetric history on file.      Home Medications    Prior to Admission medications   Medication Sig Start Date End Date Taking? Authorizing Provider  ondansetron (ZOFRAN-ODT) 4 MG disintegrating tablet Take 1 tablet (4 mg total) by mouth every 8 (eight) hours as needed for nausea or vomiting. 10/24/22  Yes Talbot Grumbling, FNP  albuterol (VENTOLIN HFA) 108 (90 Base) MCG/ACT inhaler Inhale 2 puffs into the lungs every 4 (four) hours as needed for wheezing or shortness of breath. 07/09/22   Farrel Conners, MD  cyclobenzaprine (FLEXERIL) 10 MG tablet Take 1 tablet (10 mg total) by mouth 3 (three) times daily as needed for muscle spasms. 08/20/22   Farrel Conners, MD  escitalopram (LEXAPRO) 10 MG tablet Take 1 tablet (10 mg total) by mouth daily. 08/20/22   Farrel Conners, MD  metFORMIN (GLUCOPHAGE) 500 MG tablet Take 1 tablet (500 mg total) by mouth 2 (two) times daily with a meal. 10/24/22   Farrel Conners,  MD  naproxen (NAPROSYN) 375 MG tablet Take 1 tablet twice daily for back pain. Patient not taking: Reported on 10/24/2022 07/09/22   Farrel Conners, MD    Family History Family History  Problem Relation Age of Onset   Alcohol abuse Mother    Arthritis Mother    Hypertension Mother    Depression Mother    Healthy Mother    Healthy Father    Healthy Sister    Healthy Brother    Healthy Brother    Hypertension Maternal Grandmother    Cancer Maternal Grandmother    Depression Maternal Grandmother    COPD Maternal Grandmother    Arthritis Maternal Grandmother     Social History Social History   Tobacco Use   Smoking  status: Never    Passive exposure: Yes   Smokeless tobacco: Never  Vaping Use   Vaping Use: Former  Substance Use Topics   Alcohol use: Not Currently   Drug use: Never     Allergies   Other   Review of Systems Review of Systems Per HPI  Physical Exam Triage Vital Signs ED Triage Vitals  Enc Vitals Group     BP 10/24/22 1411 115/78     Pulse Rate 10/24/22 1411 85     Resp 10/24/22 1411 18     Temp 10/24/22 1411 98.1 F (36.7 C)     Temp Source 10/24/22 1411 Oral     SpO2 10/24/22 1411 100 %     Weight --      Height --      Head Circumference --      Peak Flow --      Pain Score 10/24/22 1408 5     Pain Loc --      Pain Edu? --      Excl. in [place]? --    No data found.  Updated Vital Signs BP 115/78 (BP Location: Right Arm) Comment (BP Location): large cuff  Pulse 85   Temp 98.1 F (36.7 C) (Oral)   Resp 18   LMP 10/03/2022   SpO2 100%   Visual Acuity Right Eye Distance:   Left Eye Distance:   Bilateral Distance:    Right Eye Near:   Left Eye Near:    Bilateral Near:     Physical Exam Vitals and nursing note reviewed.  Constitutional:      Appearance: She is obese. She is not ill-appearing or toxic-appearing.  HENT:     Head: Normocephalic and atraumatic.     Right Ear: Hearing, tympanic membrane, ear canal and external ear normal.     Left Ear: Hearing, tympanic membrane, ear canal and external ear normal.     Nose: Nose normal.     Mouth/Throat:     Lips: Pink.     Mouth: Mucous membranes are moist. No injury.     Tongue: No lesions. Tongue does not deviate from midline.     Palate: No mass and lesions.     Pharynx: Oropharynx is clear. Uvula midline. No pharyngeal swelling, oropharyngeal exudate, posterior oropharyngeal erythema or uvula swelling.     Tonsils: No tonsillar exudate or tonsillar abscesses.  Eyes:     General: Lids are normal. Vision grossly intact. Gaze aligned appropriately.     Extraocular Movements: Extraocular movements  intact.     Conjunctiva/sclera: Conjunctivae normal.  Cardiovascular:     Rate and Rhythm: Normal rate and regular rhythm.     Heart sounds: Normal heart sounds, S1  normal and S2 normal.  Pulmonary:     Effort: Pulmonary effort is normal. No respiratory distress.     Breath sounds: Normal breath sounds and air entry.  Abdominal:     General: Bowel sounds are normal.     Palpations: Abdomen is soft.     Tenderness: There is generalized abdominal tenderness. There is no right CVA tenderness, left CVA tenderness or guarding. Negative signs include Murphy's sign and McBurney's sign.     Comments: No peritoneal signs to abdominal exam.  Musculoskeletal:     Cervical back: Neck supple.  Lymphadenopathy:     Cervical: No cervical adenopathy.  Skin:    General: Skin is warm and dry.     Capillary Refill: Capillary refill takes less than 2 seconds.     Findings: No rash.  Neurological:     General: No focal deficit present.     Mental Status: She is alert and oriented to person, place, and time. Mental status is at baseline.     Cranial Nerves: No dysarthria or facial asymmetry.  Psychiatric:        Mood and Affect: Mood normal.        Speech: Speech normal.        Behavior: Behavior normal.        Thought Content: Thought content normal.        Judgment: Judgment normal.      UC Treatments / Results  Labs (all labs ordered are listed, but only abnormal results are displayed) Labs Reviewed  HIV ANTIBODY (ROUTINE TESTING W REFLEX)  RPR  POCT URINALYSIS DIPSTICK, ED / UC  POC URINE PREG, ED  CERVICOVAGINAL ANCILLARY ONLY    EKG   Radiology No results found.  Procedures Procedures (including critical care time)  Medications Ordered in UC Medications  ondansetron (ZOFRAN-ODT) disintegrating tablet 4 mg (4 mg Oral Given 10/24/22 1525)    Initial Impression / Assessment and Plan / UC Course  I have reviewed the triage vital signs and the nursing notes.  Pertinent labs &  imaging results that were available during my care of the patient were reviewed by me and considered in my medical decision making (see chart for details).   1.  Lower abdominal pain, nausea vomiting and diarrhea Symptoms and physical exam are consistent with acute viral gastroenteritis that will likely improve in the next 2 to 3 days with as needed use of antiemetic medications.  Patient given Zofran 4 mg in clinic, reports relief.  She may use Tylenol as needed for abdominal discomfort.  No peritoneal signs to abdominal exam, therefore deferred imaging and referral to ED.  She is nontoxic in appearance with hemodynamically stable vital signs.  May continue using Pedialyte at home to stay well-hydrated and push fluids.  Urinalysis is unremarkable for signs of urinary tract infection.  Advised to eat bland foods over the next 12 to 24 hours to allow stomach to rest, may increase diet as tolerated.  2.  Possible exposure to STD, negative pregnancy test STI labs pending.  Patient would like HIV and syphilis testing today.  Will notify patient of positive results and treat accordingly when labs come back.  Patient to avoid sexual intercourse until screening testing comes back.  Education provided regarding safe sexual practices and patient encouraged to use protection to prevent spread of STIs.  Pregnancy test is negative in clinic.  Discussed physical exam and available lab work findings in clinic with patient.  Counseled patient regarding appropriate use  of medications and potential side effects for all medications recommended or prescribed today. Discussed red flag signs and symptoms of worsening condition,when to call the PCP office, return to urgent care, and when to seek higher level of care in the emergency department. Patient verbalizes understanding and agreement with plan. All questions answered. Patient discharged in stable condition.    Final Clinical Impressions(s) / UC Diagnoses   Final  diagnoses:  Lower abdominal pain  Nausea vomiting and diarrhea  Possible exposure to STD  Negative pregnancy test     Discharge Instructions      Your evaluation suggests that your symptoms are most likely due to viral stomach illness (gastroenteritis) which will improve on its own with rest and fluids in the next few days.   I have prescribed an antinausea medication for you to take at home called Zofran. It is the same medication that we gave you in the office.  You may use tylenol 1,062m every 6 hours over the counter as needed for abdominal discomfort related to this virus.   Your urine is negative for UTI and your pregnancy test is negative.   Eat a bland diet for the next 12-24 hours (bananas, rice, white toast, and applesauce) once you are able to tolerate liquids (broth, etc). These foods are easy for your stomach to digest. Pedialyte can be purchased to help with rehydration. Drink plenty of water.   STD testing is pending and will come back in the next 2-3 days, will treat you at that time for any STDs that you have.   Please follow up with your primary care provider for further management. Return if you experience worsening or uncontrolled pain, inability to tolerate fluids by mouth, difficulty breathing, fevers 100.69F or greater, recurrent vomiting, or any other concerning symptoms. I hope you feel better!       ED Prescriptions     Medication Sig Dispense Auth. Provider   ondansetron (ZOFRAN-ODT) 4 MG disintegrating tablet Take 1 tablet (4 mg total) by mouth every 8 (eight) hours as needed for nausea or vomiting. 20 tablet STalbot Grumbling FNP      PDMP not reviewed this encounter.   STalbot Grumbling FNP 10/24/22 [place] FNP 10/24/22 [phone]

## 2022-10-24 NOTE — ED Triage Notes (Signed)
Reports abdominal pain for 7-14 days ago Diarrhea started 3 days ago.  One episode of diarrhea today.  Patient has been drinking Pedialyte.   Blood ting tissue with blowing nose.  Pounding headaches, sore throat and fatigue.    Has not had any medications

## 2022-10-25 LAB — CERVICOVAGINAL ANCILLARY ONLY
Bacterial Vaginitis (gardnerella): NEGATIVE
Candida Glabrata: NEGATIVE
Candida Vaginitis: NEGATIVE
Chlamydia: NEGATIVE
Comment: NEGATIVE
Comment: NEGATIVE
Comment: NEGATIVE
Comment: NEGATIVE
Comment: NEGATIVE
Comment: NORMAL
Neisseria Gonorrhea: NEGATIVE
Trichomonas: NEGATIVE

## 2022-10-25 LAB — RPR: RPR Ser Ql: NONREACTIVE

## 2022-10-30 ENCOUNTER — Ambulatory Visit (HOSPITAL_COMMUNITY): Payer: Medicaid Other

## 2022-11-06 ENCOUNTER — Encounter: Payer: Self-pay | Admitting: Family Medicine

## 2022-11-07 ENCOUNTER — Encounter: Payer: Self-pay | Admitting: Family Medicine

## 2022-11-07 ENCOUNTER — Ambulatory Visit (INDEPENDENT_AMBULATORY_CARE_PROVIDER_SITE_OTHER): Payer: Medicaid Other | Admitting: Family Medicine

## 2022-11-07 VITALS — BP 100/72 | HR 90 | Temp 98.9°F | Ht 70.75 in | Wt 260.8 lb

## 2022-11-07 DIAGNOSIS — Z6836 Body mass index (BMI) 36.0-36.9, adult: Secondary | ICD-10-CM | POA: Diagnosis not present

## 2022-11-07 DIAGNOSIS — N946 Dysmenorrhea, unspecified: Secondary | ICD-10-CM

## 2022-11-07 MED ORDER — NAPROXEN 500 MG PO TABS: 500.0000 mg | ORAL_TABLET | Freq: Two times a day (BID) | ORAL | 5 refills | Status: DC

## 2022-11-07 NOTE — Assessment & Plan Note (Signed)
Patinet has gained 19 pounds in the last couple of months since starting lexapro, I have had an extensive 30 minute conversation today with the patient about healthy eating habits, exercise, calorie and carb goals for sustainable and successful weight loss. I gave the patient caloric and protein daily intake values as well as described the importance of increasing fiber and water intake. I discussed weight loss medications that could be used in the treatment of this patient. Handouts on low carb eating were given to the patient.    The dietary goals listed below have been given to the patient: Total calorie intake: 2000 calories per day  Protein: 130 grams of protein per day  Fiber: 40 grams per day  Carbs: less than 100, shoot for 90 grams per day.

## 2022-11-07 NOTE — Patient Instructions (Addendum)
Total calorie intake: 2000 calories per day  Protein: 130 grams of protein per day  Fiber: 40 grams per day  Carbs: less than 100, shoot for 90 grams per day.  Try reducing lexapro to 5 mg (1/2 tablet) daily. Try for 2 weeks, then message me on mychart and let me know how you're feeling.

## 2022-11-07 NOTE — Progress Notes (Signed)
Acute Office Visit  Subjective:     Patient ID: Colleen Maxwell, female    DOB: Sep 14, 2000, 22 y.o.   MRN: [id number]  Chief Complaint  Patient presents with   Menstrual Problem    Patient complains of increased menstrual cramps, woke up sweating, and right-sided low back pain x3-4 days, difficulty standing upright and tried muscle relaxer with some relief    Pt is reporting new symptoms today of new right sided back pain that started when her period started on 3/4. The bleeding isn't bad but the cramping and back pain is worsening. Tried taking the muscle relaxers helped a little. States that it was 5 days late, is sexually active but uses protection, doesn't think that there is no possibility of pregnancy, not currently taking any OCPs. States that when she was a teenager she had very irregular periods and was placed on OCPs in the past and her periods were regular for a time. States that she hasn't been on this for some time.   Pt is concerned about weight gain-- states that she has gained about 19 pounds since December-- likely due to the lexapro-- she started metformin 500 mg BID last week.     Review of Systems  All other systems reviewed and are negative.       Objective:    BP 100/72 (BP Location: Left Arm, Patient Position: Sitting, Cuff Size: Large)   Pulse 90   Temp 98.9 F (37.2 C) (Oral)   Ht 5' 10.75" (1.797 m)   Wt 260 lb 12.8 oz (118.3 kg)   LMP 11/04/2022 (Exact Date)   SpO2 99%   BMI 36.63 kg/m    Physical Exam Vitals reviewed.  Constitutional:      Appearance: Normal appearance. She is well-groomed. She is obese.  Eyes:     Conjunctiva/sclera: Conjunctivae normal.  Neck:     Thyroid: No thyromegaly.  Cardiovascular:     Rate and Rhythm: Normal rate and regular rhythm.     Pulses: Normal pulses.     Heart sounds: S1 normal and S2 normal.  Pulmonary:     Effort: Pulmonary effort is normal.     Breath sounds: Normal breath sounds and  air entry.  Abdominal:     General: Bowel sounds are normal.  Musculoskeletal:     Right lower leg: No edema.     Left lower leg: No edema.  Neurological:     Mental Status: She is alert and oriented to person, place, and time. Mental status is at baseline.     Gait: Gait is intact.  Psychiatric:        Mood and Affect: Mood and affect normal.        Speech: Speech normal.        Behavior: Behavior normal.        Judgment: Judgment normal.     No results found for any visits on 11/07/22.      Assessment & Plan:   Problem List Items Addressed This Visit       Unprioritized   Obesity    Patinet has gained 19 pounds in the last couple of months since starting lexapro, I have had an extensive 30 minute conversation today with the patient about healthy eating habits, exercise, calorie and carb goals for sustainable and successful weight loss. I gave the patient caloric and protein daily intake values as well as described the importance of increasing fiber and water intake. I discussed weight loss medications  that could be used in the treatment of this patient. Handouts on low carb eating were given to the patient.    The dietary goals listed below have been given to the patient: Total calorie intake: 2000 calories per day  Protein: 130 grams of protein per day  Fiber: 40 grams per day  Carbs: less than 100, shoot for 90 grams per day.      Other Visit Diagnoses     Menstrual cramp    -  Primary   Relevant Medications   naproxen (NAPROSYN) 500 MG tablet     We discussed various ways to treat this including restarting OCP's, however pt is worried about additional weight gain with OCP's, I advised increasing her naproxen to 500 mg BID PRN and we will wait another 3 months to see if her periods improve. RTC in 3 months. I also advised her reduce her lexapro to 5 mg daily to see if this will control her anxiety without continued weight gain. She will continue the metformin 500  mg BID also.   Meds ordered this encounter  Medications   naproxen (NAPROSYN) 500 MG tablet    Sig: Take 1 tablet (500 mg total) by mouth 2 (two) times daily with a meal.    Dispense:  60 tablet    Refill:  5    Return in about 6 weeks (around 12/19/2022) for video visit to follow up on anxiety.  Farrel Conners, MD

## 2022-12-11 MED ORDER — FLUOXETINE HCL 20 MG PO CAPS: 20.0000 mg | ORAL_CAPSULE | Freq: Every day | ORAL | 0 refills | Status: DC

## 2022-12-16 ENCOUNTER — Encounter: Payer: Self-pay | Admitting: *Deleted

## 2023-01-02 MED ORDER — FLUOXETINE HCL 10 MG PO CAPS: 10.0000 mg | ORAL_CAPSULE | Freq: Every day | ORAL | 0 refills | Status: DC

## 2023-01-02 NOTE — Addendum Note (Signed)
Addended by: Karie Georges on: 01/02/2023 08:35 AM   Modules accepted: Orders

## 2023-02-10 ENCOUNTER — Encounter: Payer: Self-pay | Admitting: Family Medicine

## 2023-02-19 ENCOUNTER — Ambulatory Visit (INDEPENDENT_AMBULATORY_CARE_PROVIDER_SITE_OTHER): Payer: Medicaid Other | Admitting: Family Medicine

## 2023-02-19 VITALS — BP 105/68 | HR 103 | Temp 98.0°F | Resp 16 | Ht 70.0 in | Wt 260.0 lb

## 2023-02-19 DIAGNOSIS — N939 Abnormal uterine and vaginal bleeding, unspecified: Secondary | ICD-10-CM | POA: Diagnosis not present

## 2023-02-19 DIAGNOSIS — K219 Gastro-esophageal reflux disease without esophagitis: Secondary | ICD-10-CM

## 2023-02-19 DIAGNOSIS — K591 Functional diarrhea: Secondary | ICD-10-CM | POA: Diagnosis not present

## 2023-02-19 DIAGNOSIS — Z0001 Encounter for general adult medical examination with abnormal findings: Secondary | ICD-10-CM

## 2023-02-19 DIAGNOSIS — R110 Nausea: Secondary | ICD-10-CM | POA: Diagnosis not present

## 2023-02-19 MED ORDER — FAMOTIDINE 40 MG PO TABS: 40.0000 mg | ORAL_TABLET | Freq: Every day | ORAL | 5 refills | Status: DC

## 2023-02-19 MED ORDER — ONDANSETRON 4 MG PO TBDP: 4.0000 mg | ORAL_TABLET | Freq: Three times a day (TID) | ORAL | 2 refills | Status: DC | PRN

## 2023-02-19 MED ORDER — DROSPIRENONE-ETHINYL ESTRADIOL 3-0.03 MG PO TABS: 1.0000 | ORAL_TABLET | Freq: Every day | ORAL | 11 refills | Status: DC

## 2023-02-19 NOTE — Patient Instructions (Addendum)
Magnesium supplements for leg cramps Health Maintenance, Female Adopting a healthy lifestyle and getting preventive care are important in promoting health and wellness. Ask your health care provider about: The right schedule for you to have regular tests and exams. Things you can do on your own to prevent diseases and keep yourself healthy. What should I know about diet, weight, and exercise? Eat a healthy diet  Eat a diet that includes plenty of vegetables, fruits, low-fat dairy products, and lean protein. Do not eat a lot of foods that are high in solid fats, added sugars, or sodium. Maintain a healthy weight Body mass index (BMI) is used to identify weight problems. It estimates body fat based on height and weight. Your health care provider can help determine your BMI and help you achieve or maintain a healthy weight. Get regular exercise Get regular exercise. This is one of the most important things you can do for your health. Most adults should: Exercise for at least 150 minutes each week. The exercise should increase your heart rate and make you sweat (moderate-intensity exercise). Do strengthening exercises at least twice a week. This is in addition to the moderate-intensity exercise. Spend less time sitting. Even light physical activity can be beneficial. Watch cholesterol and blood lipids Have your blood tested for lipids and cholesterol at 21 years of age, then have this test every 5 years. Have your cholesterol levels checked more often if: Your lipid or cholesterol levels are high. You are older than 21 years of age. You are at high risk for heart disease. What should I know about cancer screening? Depending on your health history and family history, you may need to have cancer screening at various ages. This may include screening for: Breast cancer. Cervical cancer. Colorectal cancer. Skin cancer. Lung cancer. What should I know about heart disease, diabetes, and high blood  pressure? Blood pressure and heart disease High blood pressure causes heart disease and increases the risk of stroke. This is more likely to develop in people who have high blood pressure readings or are overweight. Have your blood pressure checked: Every 3-5 years if you are 68-45 years of age. Every year if you are 22 years old or older. Diabetes Have regular diabetes screenings. This checks your fasting blood sugar level. Have the screening done: Once every three years after age 55 if you are at a normal weight and have a low risk for diabetes. More often and at a younger age if you are overweight or have a high risk for diabetes. What should I know about preventing infection? Hepatitis B If you have a higher risk for hepatitis B, you should be screened for this virus. Talk with your health care provider to find out if you are at risk for hepatitis B infection. Hepatitis C Testing is recommended for: Everyone born from [birth year] through 1964. Anyone with known risk factors for hepatitis C. Sexually transmitted infections (STIs) Get screened for STIs, including gonorrhea and chlamydia, if: You are sexually active and are younger than 21 years of age. You are older than 21 years of age and your health care provider tells you that you are at risk for this type of infection. Your sexual activity has changed since you were last screened, and you are at increased risk for chlamydia or gonorrhea. Ask your health care provider if you are at risk. Ask your health care provider about whether you are at high risk for HIV. Your health care provider may recommend a prescription  medicine to help prevent HIV infection. If you choose to take medicine to prevent HIV, you should first get tested for HIV. You should then be tested every 3 months for as long as you are taking the medicine. Pregnancy If you are about to stop having your period (premenopausal) and you may become pregnant, seek counseling before you  get pregnant. Take 400 to 800 micrograms (mcg) of folic acid every day if you become pregnant. Ask for birth control (contraception) if you want to prevent pregnancy. Osteoporosis and menopause Osteoporosis is a disease in which the bones lose minerals and strength with aging. This can result in bone fractures. If you are 63 years old or older, or if you are at risk for osteoporosis and fractures, ask your health care provider if you should: Be screened for bone loss. Take a calcium or vitamin D supplement to lower your risk of fractures. Be given hormone replacement therapy (HRT) to treat symptoms of menopause. Follow these instructions at home: Alcohol use Do not drink alcohol if: Your health care provider tells you not to drink. You are pregnant, may be pregnant, or are planning to become pregnant. If you drink alcohol: Limit how much you have to: 0-1 drink a day. Know how much alcohol is in your drink. In the [place] one drink equals one 12 oz bottle of beer (355 mL), one 5 oz glass of wine (148 mL), or one 1 oz glass of hard liquor (44 mL). Lifestyle Do not use any products that contain nicotine or tobacco. These products include cigarettes, chewing tobacco, and vaping devices, such as e-cigarettes. If you need help quitting, ask your health care provider. Do not use street drugs. Do not share needles. Ask your health care provider for help if you need support or information about quitting drugs. General instructions Schedule regular health, dental, and eye exams. Stay current with your vaccines. Tell your health care provider if: You often feel depressed. You have ever been abused or do not feel safe at home. Summary Adopting a healthy lifestyle and getting preventive care are important in promoting health and wellness. Follow your health care provider's instructions about healthy diet, exercising, and getting tested or screened for diseases. Follow your health care provider's  instructions on monitoring your cholesterol and blood pressure. This information is not intended to replace advice given to you by your health care provider. Make sure you discuss any questions you have with your health care provider. Document Revised: 01/08/2021 Document Reviewed: 01/08/2021 Elsevier Patient Education  2024 [employer].

## 2023-02-19 NOTE — Progress Notes (Signed)
Complete physical exam  Patient: Colleen Maxwell   DOB: Mar 15, 2001   21 y.o. Female  MRN: [id number]  Subjective:    Chief Complaint  Patient presents with   Annual Exam    Annual Exam    Colleen Maxwell is a 22 y.o. female who presents today for a complete physical exam. She reports consuming a general diet. Home exercise routine includes walking daily but she is having some leg cramps. She generally feels poorly. She reports sleeping fairly well. She does have additional problems to discuss today.   Patient is reporting continued pain and severe back cramps with periods, states that the naproxen does help but they are severe, she had to call off work last week during her period.   She is also reporting legs cramps that she is getting often and also constant nausea and feels like there is regurgitation up into her throat. States that this nausea has been off and on since her urgent care visit in February. States it comes and goes but this time it has remained. She feels like there is always something coming up the back of her throat. She is also having diarrhea as well, of and on, states that every time she eats she needs to rush to the bathroom. No fever/chills, no abdominal pain.    Most recent fall risk assessment:    02/19/2023    8:43 AM  Fall Risk   Falls in the past year? 0  Number falls in past yr: 0  Injury with Fall? 0     Most recent depression screenings:    02/19/2023    8:42 AM 08/20/2022    8:33 AM  PHQ 2/9 Scores  PHQ - 2 Score 2 2  PHQ- 9 Score 10 16    Vision:Not within last year  and Dental: Current dental problems and No regular dental care   Patient Active Problem List   Diagnosis Date Noted   Generalized abdominal pain 07/10/2022   Generalized anxiety disorder 07/10/2022   Lumbar radiculopathy 03/29/2021   Pre-diabetes 10/12/2018   Abnormal uterine bleeding 07/23/2018   Sleep disturbance 08/21/2017   Obesity 11/13/2016    Mild intermittent asthma with acute exacerbation 09/14/2015   Perennial allergic rhinitis 09/14/2015      Patient Care Team: Karie Georges, MD as PCP - General (Family Medicine)   Outpatient Medications Prior to Visit  Medication Sig   albuterol (VENTOLIN HFA) 108 (90 Base) MCG/ACT inhaler Inhale 2 puffs into the lungs every 4 (four) hours as needed for wheezing or shortness of breath.   FLUoxetine (PROZAC) 10 MG capsule Take 1 capsule (10 mg total) by mouth daily.   metFORMIN (GLUCOPHAGE) 500 MG tablet Take 1 tablet (500 mg total) by mouth 2 (two) times daily with a meal.   naproxen (NAPROSYN) 500 MG tablet Take 1 tablet (500 mg total) by mouth 2 (two) times daily with a meal.   [DISCONTINUED] ondansetron (ZOFRAN-ODT) 4 MG disintegrating tablet Take 1 tablet (4 mg total) by mouth every 8 (eight) hours as needed for nausea or vomiting.   No facility-administered medications prior to visit.    Review of Systems  Constitutional:  Positive for malaise/fatigue. Negative for chills and fever.  HENT:  Negative for hearing loss.   Eyes:  Negative for blurred vision.  Respiratory:  Negative for shortness of breath.   Cardiovascular:  Negative for chest pain.  Gastrointestinal:  Positive for abdominal pain and nausea.  Genitourinary:  Negative for  dysuria.  Musculoskeletal:  Positive for back pain.  Neurological:  Negative for dizziness and headaches.  Psychiatric/Behavioral:  Negative for suicidal ideas.        Objective:     BP 105/68 (BP Location: Left Arm, Patient Position: Sitting, Cuff Size: Normal)   Pulse (!) 103   Temp 98 F (36.7 C) (Oral)   Resp 16   Ht 5\' 10"  (1.778 m)   Wt 260 lb (117.9 kg)   SpO2 98%   BMI 37.31 kg/m    Physical Exam Vitals reviewed.  Constitutional:      Appearance: Normal appearance. She is well-groomed. She is morbidly obese.  HENT:     Right Ear: Tympanic membrane and ear canal normal.     Left Ear: Tympanic membrane and ear  canal normal.     Mouth/Throat:     Mouth: Mucous membranes are moist.     Pharynx: No posterior oropharyngeal erythema.  Eyes:     Conjunctiva/sclera: Conjunctivae normal.  Neck:     Thyroid: No thyromegaly.  Cardiovascular:     Rate and Rhythm: Normal rate and regular rhythm.     Pulses: Normal pulses.     Heart sounds: S1 normal and S2 normal.  Pulmonary:     Effort: Pulmonary effort is normal.     Breath sounds: Normal breath sounds and air entry.  Abdominal:     General: Bowel sounds are normal. There is no distension.     Palpations: Abdomen is soft.     Tenderness: There is no abdominal tenderness.  Musculoskeletal:     Right lower leg: No edema.     Left lower leg: No edema.  Lymphadenopathy:     Cervical: No cervical adenopathy.  Neurological:     Mental Status: She is alert and oriented to person, place, and time. Mental status is at baseline.     Gait: Gait is intact.  Psychiatric:        Mood and Affect: Mood and affect normal.        Speech: Speech normal.        Behavior: Behavior normal.        Judgment: Judgment normal.     No results found for any visits on 02/19/23. Last metabolic panel Lab Results  Component Value Date   GLUCOSE 94 07/09/2022   NA 140 07/09/2022   K 4.3 07/09/2022   CL 107 07/09/2022   CO2 26 07/09/2022   BUN 14 07/09/2022   CREATININE 0.69 07/09/2022   GFRNONAA >60 06/09/2021   CALCIUM 9.1 07/09/2022   PROT 7.4 07/09/2022   ALBUMIN 4.5 07/09/2022   LABGLOB 2.7 07/23/2018   AGRATIO 1.8 07/23/2018   BILITOT 0.2 07/09/2022   ALKPHOS 65 07/09/2022   AST 17 07/09/2022   ALT 8 07/09/2022   ANIONGAP 11 06/09/2021        Assessment & Plan:    Routine Health Maintenance and Physical Exam  Immunization History  Administered Date(s) Administered   Influenza,inj,Quad PF,6+ Mos 07/09/2022    Health Maintenance  Topic Date Due   COVID-19 Vaccine (1) Never done   HPV VACCINES (1 - 2-dose series) Never done   Hepatitis C  Screening  Never done   DTaP/Tdap/Td (1 - Tdap) Never done   INFLUENZA VACCINE  04/03/2023   CHLAMYDIA SCREENING  10/25/2023   HIV Screening  Completed    Discussed health benefits of physical activity, and encouraged her to engage in regular exercise appropriate for her age and  condition.  Nausea -     continues symptoms even after her period is finished. She reports this back pain also, no abdominal pain. Reviewed her labs from February which were normal. Will begin work up with [place] of liver/ GB to rule out gallstones/ GB disease. Will treat the nausea with ondansetron and also the reflux with famotidine, see below. RTC 3 months.  US ABDOMEN LIMITED RUQ (LIVER/GB); Future -     Ondansetron; Take 1 tablet (4 mg total) by mouth every 8 (eight) hours as needed for nausea or vomiting.  Dispense: 20 tablet; Refill: 2  Functional diarrhea -     US ABDOMEN LIMITED RUQ (LIVER/GB); Future  Abnormal uterine bleeding -   pt continues to have severe back pain with nausea, usually around her period, we had a long discussion about the risks/benefits of oral contraception for treatment of her dysmenorrhea and she is agreeable to start this today. Script sent    Drospirenone-Ethinyl Estradiol; Take 1 tablet by mouth daily.  Dispense: 28 tablet; Refill: 11  Gastroesophageal reflux disease without esophagitis -     Famotidine; Take 1 tablet (40 mg total) by mouth daily.  Dispense: 30 tablet; Refill: 5  Encounter for general adult medical examination with abnormal findings  See HPI for the additional symptoms addressed in today's visit. Her physical exam was completed and was relatively benign, dietary counseling given in visit as well as handouts on healthy eating and exercise.   Return in about 3 months (around 05/22/2023) for weight loss.     Karie Georges, MD

## 2023-02-25 ENCOUNTER — Ambulatory Visit
Admission: RE | Admit: 2023-02-25 | Discharge: 2023-02-25 | Disposition: A | Discharge status: Home / Self Care | Payer: Medicaid Other | Source: Ambulatory Visit | Attending: Family Medicine | Admitting: Family Medicine

## 2023-02-25 DIAGNOSIS — K591 Functional diarrhea: Secondary | ICD-10-CM

## 2023-02-25 DIAGNOSIS — R110 Nausea: Secondary | ICD-10-CM

## 2023-02-27 ENCOUNTER — Telehealth: Payer: Self-pay | Admitting: Family Medicine

## 2023-02-27 NOTE — Telephone Encounter (Signed)
Requesting a call to discuss results of ultrasound

## 2023-02-27 NOTE — Telephone Encounter (Signed)
Pt called back and would like to know if someone can verify her blood type?  Please call Pt back at your earliest convenience.

## 2023-02-27 NOTE — Telephone Encounter (Signed)
Spoke with the patient and advised her PCP is out of the office and will review the results of the [place] when she returns next week.  Also advised her I do not see results for blood typing that was ordered by our office and she could mention this to Dr Casimiro Needle at her next visit.

## 2023-03-03 ENCOUNTER — Encounter: Payer: Self-pay | Admitting: Family Medicine

## 2023-03-03 DIAGNOSIS — R1084 Generalized abdominal pain: Secondary | ICD-10-CM

## 2023-03-03 DIAGNOSIS — K591 Functional diarrhea: Secondary | ICD-10-CM

## 2023-03-03 DIAGNOSIS — R110 Nausea: Secondary | ICD-10-CM

## 2023-03-03 DIAGNOSIS — K760 Fatty (change of) liver, not elsewhere classified: Secondary | ICD-10-CM

## 2023-03-03 NOTE — Telephone Encounter (Signed)
Blood type is often not covered by insurance unless there is a reason-- [facility] may [facility] blood and they will check it for her for free if she would like.

## 2023-03-03 NOTE — Telephone Encounter (Signed)
Left a message for the patient to return my call.  

## 2023-03-03 NOTE — Telephone Encounter (Signed)
Patient called back and was informed of the message below.   

## 2023-03-13 ENCOUNTER — Ambulatory Visit (HOSPITAL_COMMUNITY): Payer: Medicaid Other

## 2023-03-14 ENCOUNTER — Encounter (HOSPITAL_COMMUNITY): Payer: Self-pay

## 2023-03-14 ENCOUNTER — Ambulatory Visit (HOSPITAL_COMMUNITY)
Admission: RE | Admit: 2023-03-14 | Discharge: 2023-03-14 | Disposition: A | Discharge status: Home / Self Care | Payer: Medicaid Other | Source: Ambulatory Visit | Attending: Internal Medicine | Admitting: Internal Medicine

## 2023-03-14 VITALS — BP 108/78 | HR 108 | Temp 98.6°F | Resp 16

## 2023-03-14 DIAGNOSIS — R110 Nausea: Secondary | ICD-10-CM | POA: Diagnosis present

## 2023-03-14 DIAGNOSIS — Z1152 Encounter for screening for COVID-19: Secondary | ICD-10-CM | POA: Insufficient documentation

## 2023-03-14 DIAGNOSIS — R5383 Other fatigue: Secondary | ICD-10-CM | POA: Diagnosis present

## 2023-03-14 DIAGNOSIS — E282 Polycystic ovarian syndrome: Secondary | ICD-10-CM | POA: Insufficient documentation

## 2023-03-14 DIAGNOSIS — R109 Unspecified abdominal pain: Secondary | ICD-10-CM | POA: Diagnosis present

## 2023-03-14 DIAGNOSIS — B349 Viral infection, unspecified: Secondary | ICD-10-CM | POA: Diagnosis not present

## 2023-03-14 LAB — SARS CORONAVIRUS 2 (TAT 6-24 HRS): SARS Coronavirus 2: NEGATIVE

## 2023-03-14 LAB — POCT URINALYSIS DIP (MANUAL ENTRY)
Bilirubin, UA: NEGATIVE
Blood, UA: NEGATIVE
Glucose, UA: NEGATIVE mg/dL
Ketones, POC UA: NEGATIVE mg/dL
Leukocytes, UA: NEGATIVE
Nitrite, UA: NEGATIVE
Protein Ur, POC: NEGATIVE mg/dL
Spec Grav, UA: >=1.03 — AB (ref 1.010–1.025)
Urobilinogen, UA: 0.2 E.U./dL
pH, UA: 5 (ref 5.0–8.0)

## 2023-03-14 LAB — POCT RAPID STREP A (OFFICE): Rapid Strep A Screen: NEGATIVE

## 2023-03-14 LAB — POCT URINE PREGNANCY: Preg Test, Ur: NEGATIVE

## 2023-03-14 NOTE — ED Provider Notes (Signed)
[facility]    CSN: [id number] Arrival date & time: 03/14/23  0827      History   Chief Complaint Chief Complaint  Patient presents with   Abdominal Pain   Fatigue   Nausea   Diarrhea    HPI Colleen Maxwell is a 22 y.o. female.   Patient presents to clinic for complaint of nausea, fatigue, dry throat, abdominal pain, constipation and diarrhea.  The nausea and fatigue started Tuesday however, she did work a 5 AM to 3 PM shift, after she got home she went to bed and woke up at 12 PM still tired.  For her nausea she was taking ODT Zofran, which typically makes her constipated.  She has been experience constipation and diarrhea since Tuesday.  Every time she eats she is having gas and abdominal pains.  Endorses nasal congestion and a dry throat.  Temperature at home was 99.6.  Had a at home COVID test yesterday that was negative.  She denies any recent sick contacts.  Reports her menses is due to start today, she does have PCOS.  Reports she is to start contraception soon, with the onset of her next menses.    The history is provided by the patient and medical records.  Abdominal Pain Associated symptoms: cough, diarrhea, fatigue and sore throat   Associated symptoms: no chest pain, no fever and no shortness of breath   Diarrhea Associated symptoms: abdominal pain   Associated symptoms: no fever     Past Medical History:  Diagnosis Date   Anxiety    Asthma    Hidradenitis suppurativa    History of pericarditis    Hydradenitis    Sciatica     Patient Active Problem List   Diagnosis Date Noted   Generalized abdominal pain 07/10/2022   Generalized anxiety disorder 07/10/2022   Lumbar radiculopathy 03/29/2021   Pre-diabetes 10/12/2018   Abnormal uterine bleeding 07/23/2018   Sleep disturbance 08/21/2017   Obesity 11/13/2016   Mild intermittent asthma with acute exacerbation 09/14/2015   Perennial allergic rhinitis 09/14/2015    Past  Surgical History:  Procedure Laterality Date   NO PAST SURGERIES     TONSILLECTOMY      OB History   No obstetric history on file.      Home Medications    Prior to Admission medications   Medication Sig Start Date End Date Taking? Authorizing Provider  albuterol (VENTOLIN HFA) 108 (90 Base) MCG/ACT inhaler Inhale 2 puffs into the lungs every 4 (four) hours as needed for wheezing or shortness of breath. 07/09/22  Yes Karie Georges, MD  drospirenone-ethinyl estradiol (YASMIN 28) 3-0.03 MG tablet Take 1 tablet by mouth daily. 02/19/23  Yes Karie Georges, MD  famotidine (PEPCID) 40 MG tablet Take 1 tablet (40 mg total) by mouth daily. 02/19/23  Yes Karie Georges, MD  FLUoxetine (PROZAC) 10 MG capsule Take 1 capsule (10 mg total) by mouth daily. 01/02/23  Yes Karie Georges, MD  metFORMIN (GLUCOPHAGE) 500 MG tablet Take 1 tablet (500 mg total) by mouth 2 (two) times daily with a meal. 10/24/22  Yes Karie Georges, MD  naproxen (NAPROSYN) 500 MG tablet Take 1 tablet (500 mg total) by mouth 2 (two) times daily with a meal. 11/07/22  Yes Karie Georges, MD  ondansetron (ZOFRAN-ODT) 4 MG disintegrating tablet Take 1 tablet (4 mg total) by mouth every 8 (eight) hours as needed for nausea or vomiting. 02/19/23  Yes Nira Conn  M, MD    Family History Family History  Problem Relation Age of Onset   Alcohol abuse Mother    Arthritis Mother    Hypertension Mother    Depression Mother    Healthy Mother    Healthy Father    Healthy Sister    Healthy Brother    Healthy Brother    Hypertension Maternal Grandmother    Cancer Maternal Grandmother    Depression Maternal Grandmother    COPD Maternal Grandmother    Arthritis Maternal Grandmother     Social History Social History   Tobacco Use   Smoking status: Never    Passive exposure: Yes   Smokeless tobacco: Never  Vaping Use   Vaping status: Former  Substance Use Topics   Alcohol use: Not Currently   Drug  use: Never     Allergies   Other   Review of Systems Review of Systems  Constitutional:  Positive for fatigue. Negative for fever.  HENT:  Positive for congestion and sore throat.   Respiratory:  Positive for cough. Negative for shortness of breath and wheezing.   Cardiovascular:  Negative for chest pain.  Gastrointestinal:  Positive for abdominal pain and diarrhea.     Physical Exam Triage Vital Signs ED Triage Vitals [03/14/23 0835]  Encounter Vitals Group     BP 108/78     Systolic BP Percentile      Diastolic BP Percentile      Pulse Rate (!) 108     Resp 16     Temp 98.6 F (37 C)     Temp Source Oral     SpO2 98 %     Weight      Height      Head Circumference      Peak Flow      Pain Score      Pain Loc      Pain Education      Exclude from Growth Chart    No data found.  Updated Vital Signs BP 108/78 (BP Location: Left Arm)   Pulse (!) 108   Temp 98.6 F (37 C) (Oral)   Resp 16   LMP 03/07/2023   SpO2 98%   Visual Acuity Right Eye Distance:   Left Eye Distance:   Bilateral Distance:    Right Eye Near:   Left Eye Near:    Bilateral Near:     Physical Exam Vitals and nursing note reviewed.  Constitutional:      Appearance: Normal appearance.  HENT:     Head: Normocephalic and atraumatic.     Right Ear: External ear normal.     Left Ear: External ear normal.     Nose: Nose normal.     Mouth/Throat:     Mouth: Mucous membranes are moist.     Pharynx: Posterior oropharyngeal erythema present.  Eyes:     General: No scleral icterus. Cardiovascular:     Rate and Rhythm: Normal rate and regular rhythm.     Heart sounds: Normal heart sounds. No murmur heard. Pulmonary:     Effort: Pulmonary effort is normal. No respiratory distress.     Breath sounds: Normal breath sounds.  Abdominal:     General: Abdomen is flat. Bowel sounds are normal.     Palpations: Abdomen is soft.     Tenderness: There is abdominal tenderness in the left lower  quadrant. There is no guarding or rebound. Negative signs include Murphy's sign and McBurney's sign.  Hernia: No hernia is present.  Musculoskeletal:        General: Normal range of motion.  Skin:    General: Skin is warm and dry.  Neurological:     General: No focal deficit present.     Mental Status: She is alert and oriented to person, place, and time.  Psychiatric:        Mood and Affect: Mood normal.      UC Treatments / Results  Labs (all labs ordered are listed, but only abnormal results are displayed) Labs Reviewed  POCT URINALYSIS DIP (MANUAL ENTRY) - Abnormal; Notable for the following components:      Result Value   Spec Grav, UA >=1.030 (*)    All other components within normal limits  CULTURE, GROUP A STREP ([facility])  SARS CORONAVIRUS 2 (TAT 6-24 HRS)  POCT URINE PREGNANCY  POCT RAPID STREP A (OFFICE)    EKG   Radiology No results found.  Procedures Procedures (including critical care time)  Medications Ordered in UC Medications - No data to display  Initial Impression / Assessment and Plan / UC Course  I have reviewed the triage vital signs and the nursing notes.  Pertinent labs & imaging results that were available during my care of the patient were reviewed by me and considered in my medical decision making (see chart for details).  Vitals and triage reviewed, patient is hemodynamically stable.  Posterior pharynx with erythema, patient has had tonsillectomy in the past due to recurrent strep.  She is having nausea.  Left lower quadrant abdominal tenderness without guarding or rebound, abdomen is soft with active bowel sounds.  Low concern for acute abdomen. Rapid strep negative, will send for culture. Urinalysis with high specific gravity, negative for nitrates, leukocytes or red blood cells.  Low concern for urinary tract infection.  Urine pregnancy also negative, reports her menses is due today but does have PCOS.  Discussed that her symptoms are most  likely due to a viral illness.  COVID-19 testing obtained and staff to contact if positive.  Symptomatic management discussed as well as return and follow-up precautions, no questions at this time.     Final Clinical Impressions(s) / UC Diagnoses   Final diagnoses:  Viral illness     Discharge Instructions      Overall your physical exam is reassuring.  Your urine did not show signs of infection.  Your urine pregnancy was also negative.  Your strep testing was negative as well.  I believe you are suffering from a viral illness, we have swabbed you for COVID-19 and we will contact you if positive.  Please rest, ensure you are drinking at least 64 ounces of water daily, and continue using your nausea medication as needed.  I suggest a bland diet such as rice, toast, applesauce and avoiding fried or processed foods to avoid further upsetting your stomach.  Please return to clinic for any new or urgent symptoms.      ED Prescriptions   None    PDMP not reviewed this encounter.   Muneeb Veras, [place], [place] 03/14/23 [phone]

## 2023-03-14 NOTE — ED Triage Notes (Signed)
Pt presents nausea and fatigue x 3 days. Pt reports diarrhea and constipation. Pt reports she just started Metformin one month ago.

## 2023-03-14 NOTE — Discharge Instructions (Addendum)
Overall your physical exam is reassuring.  Your urine did not show signs of infection.  Your urine pregnancy was also negative.  Your strep testing was negative as well.  I believe you are suffering from a viral illness, we have swabbed you for COVID-19 and we will contact you if positive.  Please rest, ensure you are drinking at least 64 ounces of water daily, and continue using your nausea medication as needed.  I suggest a bland diet such as rice, toast, applesauce and avoiding fried or processed foods to avoid further upsetting your stomach.  Please return to clinic for any new or urgent symptoms.

## 2023-03-17 ENCOUNTER — Other Ambulatory Visit: Payer: Self-pay | Admitting: Family Medicine

## 2023-03-17 DIAGNOSIS — F411 Generalized anxiety disorder: Secondary | ICD-10-CM

## 2023-03-17 LAB — CULTURE, GROUP A STREP (THRC)

## 2023-03-27 ENCOUNTER — Other Ambulatory Visit: Payer: Self-pay | Admitting: Family Medicine

## 2023-03-27 DIAGNOSIS — T50905A Adverse effect of unspecified drugs, medicaments and biological substances, initial encounter: Secondary | ICD-10-CM

## 2023-04-07 ENCOUNTER — Ambulatory Visit (HOSPITAL_COMMUNITY)
Admission: RE | Admit: 2023-04-07 | Discharge: 2023-04-07 | Disposition: A | Discharge status: Home / Self Care | Payer: Medicaid Other | Source: Ambulatory Visit | Attending: Family Medicine | Admitting: Family Medicine

## 2023-04-07 DIAGNOSIS — R1084 Generalized abdominal pain: Secondary | ICD-10-CM | POA: Insufficient documentation

## 2023-04-07 MED ORDER — TECHNETIUM TC 99M MEBROFENIN IV KIT
4.6000 | PACK | Freq: Once | INTRAVENOUS | Status: AC | PRN
  Administered 2023-04-07: 4.6 via INTRAVENOUS

## 2023-04-17 ENCOUNTER — Encounter (INDEPENDENT_AMBULATORY_CARE_PROVIDER_SITE_OTHER): Payer: Self-pay

## 2023-05-12 ENCOUNTER — Telehealth (INDEPENDENT_AMBULATORY_CARE_PROVIDER_SITE_OTHER): Payer: Medicaid Other | Admitting: Family Medicine

## 2023-05-12 DIAGNOSIS — F411 Generalized anxiety disorder: Secondary | ICD-10-CM | POA: Diagnosis not present

## 2023-05-12 DIAGNOSIS — N939 Abnormal uterine and vaginal bleeding, unspecified: Secondary | ICD-10-CM

## 2023-05-12 NOTE — Assessment & Plan Note (Signed)
Pt is now on effexor, 75 mg daily, is seeing a [facility] provider for this. I have reviewed her medications and updated her med list.

## 2023-05-12 NOTE — Assessment & Plan Note (Signed)
On OCPs, these are working well for her per her report. Will continue the medication.

## 2023-05-12 NOTE — Progress Notes (Signed)
   Virtual Medical Office Visit  Patient:  Colleen Maxwell      Age: 22 y.o.       Sex:  female  Date:   05/12/2023  PCP:    Karie Georges, MD   Today's Healthcare Provider: Karie Georges, MD    Assessment/Plan:   Summary assessment:  Generalized anxiety disorder Assessment & Plan: Pt is now on effexor, 75 mg daily, is seeing a [facility] provider for this. I have reviewed her medications and updated her med list.    Abnormal uterine bleeding       Assessment & Plan: On OCPs, these are working well for her per her report. Will continue the medication.       Return in about 6 months (around 11/09/2023) for follow up.   She was advised to call the office or go to ER if her condition worsens    Subjective:   Colleen Maxwell is a 22 y.o. female with PMH significant for: Past Medical History:  Diagnosis Date   Anxiety    Asthma    Hidradenitis suppurativa    History of pericarditis    Hydradenitis    Sciatica      Presenting today with: No chief complaint on file.    She clarifies and reports that her condition: Patient reports her abdominal pain comes and goes but it appears better than previous. States that she is trying to reduce the gluten in the diet. We reviewed her HIDA scan which was normal. Pt reports she started seeing a mental health provider who stopped her fluoxetine and started effexor instead. States that she is not having any side effects from the medication, reports decreased appetite but otherwise seems to be tolerating it well. We reviewed her medications and I updated her med list.  She rpeorts that the OCP's are working well for her, has improved her period symptoms.            Objective/Observations  Physical Exam:  Polite and friendly Gen: NAD, resting comfortably Pulm: Normal work of breathing Neuro: Grossly normal, moves all extremities Psych: Normal affect and thought content Problem specific physical exam  findings:    No images are attached to the encounter or orders placed in the encounter.    Results: No results found for any visits on 05/12/23.        Virtual Visit via Video   I connected with Lance Morin on 05/12/23 at 11:00 AM EDT by a video enabled telemedicine application and verified that I am speaking with the correct person using two identifiers. The limitations of evaluation and management by telemedicine and the availability of in person appointments were discussed. The patient expressed understanding and agreed to proceed.   Percentage of appointment time on video:  100% Patient location: Home Provider location: [facility] Persons participating in the virtual visit: Myself and Patient

## 2023-05-22 ENCOUNTER — Ambulatory Visit: Payer: Medicaid Other | Admitting: Family Medicine

## 2023-06-04 ENCOUNTER — Encounter: Payer: Self-pay | Admitting: Gastroenterology

## 2023-06-04 ENCOUNTER — Other Ambulatory Visit: Payer: Medicaid Other

## 2023-06-04 ENCOUNTER — Ambulatory Visit: Payer: Medicaid Other | Admitting: Gastroenterology

## 2023-06-04 VITALS — BP 124/80 | HR 94 | Ht 70.0 in | Wt 273.0 lb

## 2023-06-04 DIAGNOSIS — R197 Diarrhea, unspecified: Secondary | ICD-10-CM | POA: Diagnosis not present

## 2023-06-04 DIAGNOSIS — K760 Fatty (change of) liver, not elsewhere classified: Secondary | ICD-10-CM

## 2023-06-04 DIAGNOSIS — R112 Nausea with vomiting, unspecified: Secondary | ICD-10-CM

## 2023-06-04 DIAGNOSIS — K589 Irritable bowel syndrome without diarrhea: Secondary | ICD-10-CM

## 2023-06-04 DIAGNOSIS — R1013 Epigastric pain: Secondary | ICD-10-CM

## 2023-06-04 MED ORDER — DICYCLOMINE HCL 20 MG PO TABS: 20.0000 mg | ORAL_TABLET | Freq: Four times a day (QID) | ORAL | 1 refills | Status: DC | PRN

## 2023-06-04 NOTE — Patient Instructions (Signed)
Your provider has requested that you go to the basement level for lab work before leaving today. Press "B" on the elevator. The lab is located at the first door on the left as you exit the elevator.  Please purchase Metamucil over the counter. Take as directed.   We have sent the following medications to your pharmacy for you to pick up at your convenience: Bentyl 20 mg every 6 hours as needed.  Your provider has requested that you go to the basement level for lab work before leaving today. Press "B" on the elevator. The lab is located at the first door on the left as you exit the elevator.     If your blood pressure at your visit was 140/90 or greater, please contact your primary care physician to follow up on this.  _______________________________________________________  If you are age 60 or older, your body mass index should be between 23-30. Your Body mass index is 39.17 kg/m. If this is out of the aforementioned range listed, please consider follow up with your Primary Care Provider.  If you are age 21 or younger, your body mass index should be between 19-25. Your Body mass index is 39.17 kg/m. If this is out of the aformentioned range listed, please consider follow up with your Primary Care Provider.   ________________________________________________________  The [facility] providers would like to encourage you to use [facility] to communicate with providers for non-urgent requests or questions.  Due to long hold times on the telephone, sending your provider a message by [facility] may be a faster and more efficient way to get a response.  Please allow 48 business hours for a response.  Please remember that this is for non-urgent requests.   It was a pleasure to see you today!  Thank you for trusting me with your gastrointestinal care!    Scott E.Tomasa Rand, MD

## 2023-06-04 NOTE — Progress Notes (Unsigned)
HPI : Colleen Maxwell is a 22 y.o. female with a history of anxiety and hidradenitis who is referred to [place] by Karie Georges, MD for further evaluation of abdominal pain, nausea and fatty liver.  The patient states that she had been having issues with upper abdominal pain and nausea for a couple months, but these symptoms have since improved significantly.  She does still have some issues with mild abdominal pain, but no nausea.  She has also been having more frequent and looser stools over that time.  This seemed to resolve, but then started again about 3 weeks ago.  She has been having 2-4 bowel movements per day with stools that are poorly formed/mushy.  She is to have 1-2 formed bowel movements most the time.  She typically has a bowel movement very shortly after eating.  No significant urgency.  No incontinence.  No nocturnal bowel movements.  She denies any blood in the stool.  She often experiences crampy pain preceding the urge to defecate.  Constipation has not been an issue for her..  No weight loss.  The patient states that she cut back on gluten and dairy after the symptoms started and this seemed to help.  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  She has undergone celiac testing which was negative. She started Effexor recently, but states that her symptoms started before she started taking the Effexor.  PCP ordered a right upper quadrant ultrasound to rule out gallstones.  No gallstones were seen, but the patient was noted to have fatty liver.  A subsequent HIDA scan showed a very mildly depressed gallbladder ejection fraction. The patient has no history of elevated liver enzymes.  She rarely drinks alcohol.  No family history of liver disease that she is aware of.   HIDA scan Apr 07, 2023 IMPRESSION: Normal hepatobiliary scan with a slightly decreased gallbladder ejection fraction   RUQUS February 25, 2023 IMPRESSION: 1. The gallbladder and common bile  duct are normal. 2. Diffuse increased echogenicity throughout the liver is nonspecific and may be seen with hepatic steatosis or other underlying intrinsic liver disease  Past Medical History:  Diagnosis Date   Anxiety    Asthma    Hidradenitis suppurativa    History of pericarditis    Hydradenitis    Sciatica      Past Surgical History:  Procedure Laterality Date   NO PAST SURGERIES     TONSILLECTOMY     Family History  Problem Relation Age of Onset   Alcohol abuse Mother    Arthritis Mother    Hypertension Mother    Depression Mother    Healthy Mother    Healthy Father    Healthy Sister    Healthy Brother    Healthy Brother    Hypertension Maternal Grandmother    Cancer Maternal Grandmother    Depression Maternal Grandmother    COPD Maternal Grandmother    Arthritis Maternal Grandmother    Social History   Tobacco Use   Smoking status: Never    Passive exposure: Yes   Smokeless tobacco: Never  Vaping Use   Vaping status: Former  Substance Use Topics   Alcohol use: Not Currently   Drug use: Never   Current Outpatient Medications  Medication Sig Dispense Refill   albuterol (VENTOLIN HFA) 108 (90 Base) MCG/ACT inhaler Inhale 2 puffs into the lungs every 4 (four) hours as needed for wheezing or shortness of breath. 1 each 11  drospirenone-ethinyl estradiol (YASMIN 28) 3-0.03 MG tablet Take 1 tablet by mouth daily. 28 tablet 11   famotidine (PEPCID) 40 MG tablet Take 1 tablet (40 mg total) by mouth daily. 30 tablet 5   metFORMIN (GLUCOPHAGE) 500 MG tablet TAKE 1 TABLET(500 MG) BY MOUTH TWICE DAILY WITH A MEAL 60 tablet 3   naproxen (NAPROSYN) 500 MG tablet Take 1 tablet (500 mg total) by mouth 2 (two) times daily with a meal. 60 tablet 5   ondansetron (ZOFRAN-ODT) 4 MG disintegrating tablet Take 1 tablet (4 mg total) by mouth every 8 (eight) hours as needed for nausea or vomiting. 20 tablet 2   venlafaxine XR (EFFEXOR-XR) 37.5 MG 24 hr capsule Take 37.5 mg by  mouth daily with breakfast.     No current facility-administered medications for this visit.   Allergies  Allergen Reactions   Other     Dust mites and cockroaches per allergy test     Review of Systems: All systems reviewed and negative except where noted in HPI.    No results found.  Physical Exam: BP 124/80   Pulse 94   Ht 5\' 10"  (1.778 m)   Wt 273 lb (123.8 kg)   BMI 39.17 kg/m  Constitutional: Pleasant,well-developed, African-American female in no acute distress. HEENT: Normocephalic and atraumatic. Conjunctivae are normal. No scleral icterus. Neck supple.  Cardiovascular: Normal rate, regular rhythm.  Pulmonary/chest: Effort normal and breath sounds normal. No wheezing, rales or rhonchi. Abdominal: Soft, nondistended, nontender. Bowel sounds active throughout. There are no masses palpable. No hepatomegaly. Extremities: no edema Neurological: Alert and oriented to person place and time. Skin: Skin is warm and dry. No rashes noted. Psychiatric: Normal mood and affect. Behavior is normal.  CBC    Component Value Date/Time   WBC 6.9 07/09/2022 0903   RBC 4.49 07/09/2022 0903   HGB 13.2 07/09/2022 0903   HGB 13.4 07/23/2018 1639   HCT 39.7 07/09/2022 0903   HCT 40.6 07/23/2018 1639   PLT 285.0 07/09/2022 0903   PLT 281 07/23/2018 1639   MCV 88.4 07/09/2022 0903   MCV 87 07/23/2018 1639   MCH 28.8 06/09/2021 1533   MCHC 33.2 07/09/2022 0903   RDW 13.2 07/09/2022 0903   RDW 13.0 07/23/2018 1639   LYMPHSABS 1.9 07/09/2022 0903   MONOABS 0.5 07/09/2022 0903   EOSABS 0.0 07/09/2022 0903   BASOSABS 0.1 07/09/2022 0903    CMP     Component Value Date/Time   NA 140 07/09/2022 0903   NA 143 07/23/2018 1639   K 4.3 07/09/2022 0903   CL 107 07/09/2022 0903   CO2 26 07/09/2022 0903   GLUCOSE 94 07/09/2022 0903   BUN 14 07/09/2022 0903   BUN 13 07/23/2018 1639   CREATININE 0.69 07/09/2022 0903   CALCIUM 9.1 07/09/2022 0903   PROT 7.4 07/09/2022 0903   PROT  7.5 07/23/2018 1639   ALBUMIN 4.5 07/09/2022 0903   ALBUMIN 4.8 07/23/2018 1639   AST 17 07/09/2022 0903   ALT 8 07/09/2022 0903   ALKPHOS 65 07/09/2022 0903   BILITOT 0.2 07/09/2022 0903   BILITOT 0.2 07/23/2018 1639   GFRNONAA >60 06/09/2021 1533   GFRAA CANCELED 07/23/2018 1639       Latest Ref Rng & Units 07/09/2022    9:03 AM 06/09/2021    3:33 PM 03/25/2021   11:15 PM  CBC EXTENDED  WBC 4.5 - 10.5 K/uL 6.9  8.2  7.9   RBC 3.87 - 5.11 Mil/uL 4.49  4.80  4.30   Hemoglobin 12.0 - 15.0 g/dL 16.1  09.6  04.5   HCT 36.0 - 46.0 % 39.7  42.0  38.2   Platelets 150.0 - 400.0 K/uL 285.0  293  291   NEUT# 1.4 - 7.7 K/uL 4.3   4.3   Lymph# 0.7 - 4.0 K/uL 1.9   2.8       ASSESSMENT AND PLAN: 22 year old female with several months of abdominal pain and loose stools, previously with significant nausea but this resolved.  She reports some improvement by decreasing consumption of dairy and gluten.  Based on her demographics, symptoms and history of anxiety, I suspect she likely has IBS. We discussed the proposed pathophysiology of IBS and gut brain axis disorders in general.  We discussed management of IBS, to include use of medications to improve bowel habits, as needed pain medicine, centrally acting neuromodulators, role of empiric dietary modifications to include a low FODMAP diet gluten-free diet, as well as the role of cognitive therapies.  We discussed the goals of IBS management, namely to minimize the impact of GI symptoms on quality of life. I recommended she start taking Metamucil on a daily basis to improve her stool bulk and consistency, and to take Bentyl on a as needed basis. I suggested she completely cut out gluten for a few weeks to see if her symptoms improve further.  Will exclude H. 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  She does not drink alcohol.  She does not have elevated liver enzymes.   Will exclude other causes of chronic liver disease to include autoimmune, viral and genetic etiologies.  We discussed the pathophysiology of MASLD and the potential for progression to cirrhosis.  We discussed the management of MASLD which focuses on weight loss through diet and exercise.  Abdominal pain, diarrhea, nausea - Metamucil daily - Bentyl as needed - Gluten-free diet - H. 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 Tomasa Rand, MD [facility]  I spent a total of 40 minutes reviewing the patient's medical record, interviewing and examining the patient, discussing her diagnosis and management of her condition going forward, and documenting in the medical record    Karie Georges, MD

## 2023-06-06 LAB — H. PYLORI ANTIGEN, STOOL: H pylori Ag, Stl: NEGATIVE

## 2023-06-07 LAB — HEPATITIS B CORE ANTIBODY, TOTAL: Hep B Core Total Ab: NONREACTIVE

## 2023-06-07 LAB — ANTI-SMOOTH MUSCLE ANTIBODY, IGG: Actin (Smooth Muscle) Antibody (IGG): <20 U (ref <20)

## 2023-06-07 LAB — HEPATITIS B SURFACE ANTIGEN: Hepatitis B Surface Ag: NONREACTIVE

## 2023-06-07 LAB — ALPHA-1-ANTITRYPSIN: A-1 Antitrypsin, Ser: 128 mg/dL (ref 83–199)

## 2023-06-07 LAB — MITOCHONDRIAL ANTIBODIES: Mitochondrial M2 Ab, IgG: <=20 U (ref <=20.0)

## 2023-06-10 ENCOUNTER — Ambulatory Visit
Admission: RE | Admit: 2023-06-10 | Discharge: 2023-06-10 | Disposition: A | Discharge status: Home / Self Care | Payer: Medicaid Other | Source: Ambulatory Visit | Attending: Internal Medicine | Admitting: Internal Medicine

## 2023-06-10 VITALS — BP 133/82 | HR 110 | Temp 98.4°F | Resp 20

## 2023-06-10 DIAGNOSIS — Z1152 Encounter for screening for COVID-19: Secondary | ICD-10-CM | POA: Diagnosis not present

## 2023-06-10 DIAGNOSIS — J4530 Mild persistent asthma, uncomplicated: Secondary | ICD-10-CM | POA: Diagnosis not present

## 2023-06-10 DIAGNOSIS — B349 Viral infection, unspecified: Secondary | ICD-10-CM | POA: Insufficient documentation

## 2023-06-10 DIAGNOSIS — Z8679 Personal history of other diseases of the circulatory system: Secondary | ICD-10-CM | POA: Insufficient documentation

## 2023-06-10 MED ORDER — CETIRIZINE HCL 10 MG PO TABS
10.0000 mg | ORAL_TABLET | Freq: Every day | ORAL | 0 refills | Status: AC
Start: 2023-06-10 — End: ?

## 2023-06-10 MED ORDER — PROMETHAZINE-DM 6.25-15 MG/5ML PO SYRP: 5.0000 mL | ORAL_SOLUTION | Freq: Three times a day (TID) | ORAL | 0 refills | Status: DC | PRN

## 2023-06-10 MED ORDER — IPRATROPIUM BROMIDE 0.03 % NA SOLN: 2.0000 | Freq: Two times a day (BID) | NASAL | 0 refills | Status: DC

## 2023-06-10 NOTE — ED Provider Notes (Signed)
[facility]  Note:  This document was prepared using Conservation officer, historic buildings and may include unintentional dictation errors.  MRN: [id number] DOB: 07/21/2001  Subjective:   Colleen Maxwell is a 22 y.o. female presenting for 2-day history of persistent throat pain, sinus congestion, hoarseness, fatigue, headaches, general malaise.  Patient did a COVID and flu test at home both of which were negative.  Denies fever, chest pain, shortness of breath or wheezing.  She did have pericarditis following COVID-19 infection in 2021 or 2022.  Has a history of asthma, does not need a refill on her albuterol inhaler.  No smoking of any kind including cigarettes, cigars, vaping, marijuana use.    No current facility-administered medications for this encounter.  Current Outpatient Medications:    albuterol (VENTOLIN HFA) 108 (90 Base) MCG/ACT inhaler, Inhale 2 puffs into the lungs every 4 (four) hours as needed for wheezing or shortness of breath., Disp: 1 each, Rfl: 11   dicyclomine (BENTYL) 20 MG tablet, Take 1 tablet (20 mg total) by mouth every 6 (six) hours as needed for spasms., Disp: 30 tablet, Rfl: 1   drospirenone-ethinyl estradiol (YASMIN 28) 3-0.03 MG tablet, Take 1 tablet by mouth daily., Disp: 28 tablet, Rfl: 11   famotidine (PEPCID) 40 MG tablet, Take 1 tablet (40 mg total) by mouth daily., Disp: 30 tablet, Rfl: 5   metFORMIN (GLUCOPHAGE) 500 MG tablet, TAKE 1 TABLET(500 MG) BY MOUTH TWICE DAILY WITH A MEAL (Patient not taking: Reported on 06/04/2023), Disp: 60 tablet, Rfl: 3   naproxen (NAPROSYN) 500 MG tablet, Take 1 tablet (500 mg total) by mouth 2 (two) times daily with a meal., Disp: 60 tablet, Rfl: 5   ondansetron (ZOFRAN-ODT) 4 MG disintegrating tablet, Take 1 tablet (4 mg total) by mouth every 8 (eight) hours as needed for nausea or vomiting., Disp: 20 tablet, Rfl: 2   venlafaxine XR (EFFEXOR-XR) 37.5 MG 24 hr capsule, Take 37.5 mg by mouth  daily with breakfast., Disp: , Rfl:    Allergies  Allergen Reactions   Other     Dust mites and cockroaches per allergy test    Past Medical History:  Diagnosis Date   Anxiety    Asthma    Depression    Hidradenitis suppurativa    History of pericarditis    Hydradenitis    PTSD (post-traumatic stress disorder)    Sciatica      Past Surgical History:  Procedure Laterality Date   TONSILLECTOMY      Family History  Problem Relation Age of Onset   Alcohol abuse Mother    Arthritis Mother    Hypertension Mother    Depression Mother    Healthy Mother    Healthy Father    Healthy Sister    Healthy Brother    Healthy Brother    Hypertension Maternal Grandmother    Depression Maternal Grandmother    COPD Maternal Grandmother    Arthritis Maternal Grandmother    Lung cancer Paternal Grandmother    Colon cancer Neg Hx    Esophageal cancer Neg Hx     Social History   Tobacco Use   Smoking status: Never    Passive exposure: Yes   Smokeless tobacco: Never  Vaping Use   Vaping status: Former  Substance Use Topics   Alcohol use: Not Currently   Drug use: Never    ROS   Objective:   Vitals: BP 133/82 (BP Location: Right Arm)   Pulse Marland Kitchen)  110   Temp 98.4 F (36.9 C) (Oral)   Resp 20   LMP 05/16/2023   SpO2 96%   Physical Exam Constitutional:      General: She is not in acute distress.    Appearance: Normal appearance. She is well-developed and normal weight. She is not ill-appearing, toxic-appearing or diaphoretic.  HENT:     Head: Normocephalic and atraumatic.     Right Ear: Tympanic membrane, ear canal and external ear normal. No drainage or tenderness. No middle ear effusion. There is no impacted cerumen. Tympanic membrane is not erythematous or bulging.     Left Ear: Tympanic membrane, ear canal and external ear normal. No drainage or tenderness.  No middle ear effusion. There is no impacted cerumen. Tympanic membrane is not erythematous or bulging.      Nose: Congestion present. No rhinorrhea.     Mouth/Throat:     Mouth: Mucous membranes are moist. No oral lesions.     Pharynx: No pharyngeal swelling, oropharyngeal exudate, posterior oropharyngeal erythema or uvula swelling.     Tonsils: No tonsillar exudate or tonsillar abscesses.  Eyes:     General: No scleral icterus.       Right eye: No discharge.        Left eye: No discharge.     Extraocular Movements: Extraocular movements intact.     Right eye: Normal extraocular motion.     Left eye: Normal extraocular motion.     Conjunctiva/sclera: Conjunctivae normal.  Cardiovascular:     Rate and Rhythm: Normal rate and regular rhythm.     Heart sounds: Normal heart sounds. No murmur heard.    No friction rub. No gallop.  Pulmonary:     Effort: Pulmonary effort is normal. No respiratory distress.     Breath sounds: No stridor. No wheezing, rhonchi or rales.  Chest:     Chest wall: No tenderness.  Musculoskeletal:     Cervical back: Normal range of motion and neck supple.  Lymphadenopathy:     Cervical: No cervical adenopathy.  Skin:    General: Skin is warm and dry.  Neurological:     General: No focal deficit present.     Mental Status: She is alert and oriented to person, place, and time.  Psychiatric:        Mood and Affect: Mood normal.        Behavior: Behavior normal.     Assessment and Plan :   PDMP not reviewed this encounter.  1. Acute viral syndrome   2. Mild persistent asthma, uncomplicated   3. History of pericarditis    Will manage for viral illness such as viral URI, viral syndrome, viral rhinitis, COVID-19. Recommended supportive care. Offered scripts for symptomatic relief. Testing is pending. Counseled patient on potential for adverse effects with medications prescribed/recommended today, ER and return-to-clinic precautions discussed, patient verbalized understanding.   Deferred imaging given clear cardiopulmonary exam, hemodynamically stable vital  signs.  Will defer steroid use given patient has significant anxiety with these particular medications.  Patient would be an excellent candidate to undergo Paxlovid treatment should she test positive.  Patient should use other forms of contraception in addition to her Yasmin if she test positive for COVID-19 and start Paxlovid.   Wallis Bamberg, PA-C 06/10/23 1110

## 2023-06-10 NOTE — ED Triage Notes (Signed)
Pt c/o sore throat, head congestion, "voice change" fatigue, HA x 2 days-denies fever-reports neg covid and flu home tests-NAD-steady gait

## 2023-06-10 NOTE — Progress Notes (Signed)
[place],  The tests for viral, autoimmune and genetic causes of liver disease were negative (normal).  As discussed your fatty liver is most likely from metabolic dysfunction associated steatotic liver disease (MASLD).  Please continue to focus on weight loss through improvements in your diet and exercise habits.  Continue to completely abstain from alcohol. Plan to repeat ultrasound in 2 years.

## 2023-06-10 NOTE — Discharge Instructions (Addendum)
We will notify you of your test results as they arrive and may take between about 24 hours.  I encourage you to sign up for MyChart if you have not already done so as this can be the easiest way for [place] to communicate results to you online or through a phone app.  Generally, we only contact you if it is a positive test result.  In the meantime, if you develop worsening symptoms including fever, chest pain, shortness of breath despite our current treatment plan then please report to the emergency room as this may be a sign of worsening status from possible viral infection.  Otherwise, we will manage this as a viral syndrome. For sore throat or cough try using a honey-based tea. Use 3 teaspoons of honey with juice squeezed from half lemon. Place shaved pieces of ginger into 1/2-1 cup of water and warm over stove top. Then mix the ingredients and repeat every 4 hours as needed. Please take Tylenol 650mg  every 6 hours for aches and pains, fevers. Hydrate very well with at least 2 liters of water. Eat light meals such as soups to replenish electrolytes and soft fruits, veggies. Start an antihistamine like Zyrtec (10mg  daily) for postnasal drainage, sinus congestion.  You can take this together with Atrovent nasal spray 2 times a day as needed for the same kind of congestion.  Use the cough medications as needed.

## 2023-06-11 LAB — SARS CORONAVIRUS 2 (TAT 6-24 HRS): SARS Coronavirus 2: NEGATIVE

## 2023-08-05 ENCOUNTER — Ambulatory Visit (INDEPENDENT_AMBULATORY_CARE_PROVIDER_SITE_OTHER): Payer: Medicaid Other | Admitting: Family Medicine

## 2023-08-05 ENCOUNTER — Encounter: Payer: Self-pay | Admitting: Family Medicine

## 2023-08-05 VITALS — BP 100/60 | HR 96 | Temp 98.4°F | Ht 70.0 in | Wt 269.4 lb

## 2023-08-05 DIAGNOSIS — J329 Chronic sinusitis, unspecified: Secondary | ICD-10-CM

## 2023-08-05 DIAGNOSIS — R252 Cramp and spasm: Secondary | ICD-10-CM

## 2023-08-05 DIAGNOSIS — R7303 Prediabetes: Secondary | ICD-10-CM | POA: Diagnosis not present

## 2023-08-05 DIAGNOSIS — K219 Gastro-esophageal reflux disease without esophagitis: Secondary | ICD-10-CM

## 2023-08-05 DIAGNOSIS — E559 Vitamin D deficiency, unspecified: Secondary | ICD-10-CM

## 2023-08-05 LAB — CBC WITH DIFFERENTIAL/PLATELET
Basophils Absolute: 0 10*3/uL (ref 0.0–0.1)
Basophils Relative: 0.5 % (ref 0.0–3.0)
Eosinophils Absolute: 0 10*3/uL (ref 0.0–0.7)
Eosinophils Relative: 0.5 % (ref 0.0–5.0)
HCT: 40.3 % (ref 36.0–46.0)
Hemoglobin: 13.1 g/dL (ref 12.0–15.0)
Lymphocytes Relative: 34.2 % (ref 12.0–46.0)
Lymphs Abs: 2.3 10*3/uL (ref 0.7–4.0)
MCHC: 32.5 g/dL (ref 30.0–36.0)
MCV: 88.2 fL (ref 78.0–100.0)
Monocytes Absolute: 0.4 10*3/uL (ref 0.1–1.0)
Monocytes Relative: 6.5 % (ref 3.0–12.0)
Neutro Abs: 3.9 10*3/uL (ref 1.4–7.7)
Neutrophils Relative %: 58.3 % (ref 43.0–77.0)
Platelets: 322 10*3/uL (ref 150.0–400.0)
RBC: 4.57 Mil/uL (ref 3.87–5.11)
RDW: 13.9 % (ref 11.5–15.5)
WBC: 6.7 10*3/uL (ref 4.0–10.5)

## 2023-08-05 LAB — COMPREHENSIVE METABOLIC PANEL
ALT: 16 U/L (ref 0–35)
AST: 19 U/L (ref 0–37)
Albumin: 4.6 g/dL (ref 3.5–5.2)
Alkaline Phosphatase: 69 U/L (ref 39–117)
BUN: 12 mg/dL (ref 6–23)
CO2: 30 meq/L (ref 19–32)
Calcium: 9.2 mg/dL (ref 8.4–10.5)
Chloride: 103 meq/L (ref 96–112)
Creatinine, Ser: 0.66 mg/dL (ref 0.40–1.20)
GFR: 125.62 mL/min (ref >60.00)
Glucose, Bld: 95 mg/dL (ref 70–99)
Potassium: 4.2 meq/L (ref 3.5–5.1)
Sodium: 138 meq/L (ref 135–145)
Total Bilirubin: 0.4 mg/dL (ref 0.2–1.2)
Total Protein: 7.3 g/dL (ref 6.0–8.3)

## 2023-08-05 LAB — CK: Total CK: 134 U/L (ref 7–177)

## 2023-08-05 LAB — HEMOGLOBIN A1C: Hgb A1c MFr Bld: 6.1 % (ref 4.6–6.5)

## 2023-08-05 LAB — VITAMIN D 25 HYDROXY (VIT D DEFICIENCY, FRACTURES): VITD: 13.09 ng/mL — ABNORMAL LOW (ref 30.00–100.00)

## 2023-08-05 MED ORDER — FAMOTIDINE 40 MG PO TABS
40.0000 mg | ORAL_TABLET | Freq: Every day | ORAL | Status: DC | PRN
Start: 2023-08-05 — End: 2024-02-22

## 2023-08-05 NOTE — Progress Notes (Signed)
Acute Office Visit  Subjective:     Patient ID: Colleen Maxwell, female    DOB: April 17, 2001, 22 y.o.   MRN: [id number]  Chief Complaint  Patient presents with   Cough    Non-productive with x1 week   Fatigue    X1 week    Cough   Patient is in today for cough for about 1 week. States that the cough is actually getting better, but now she is having a lot of fatigue, states that for the past 2 months or so, has been having muscle cramps off and on for the past 2 weeks. States that for the past 2 months she has had chronic cough, cold and flu symptoms. States that she has been to urgent mutliple times, has been given antihistamines and other decongestant medications which do help some. States that the muscle cramps are happening a few times a month. States that that pain is fairly severe from the muscle cramps.  States that she stopped taking the yasmin because she thought it was causing the suicidal thoughts, but it turned out to be the wellbutrin instead. States that her periods are now regular so she didn't restart the yasmin.   States she also stopped the metformin as well. States that the was getting occasional stomach aches on this medication, also she reported that it suppressed her appetite a lot with the antidepressant. States she is still taking the effexor.    Review of Systems  Respiratory:  Positive for cough.   All other systems reviewed and are negative.       Objective:    BP 100/60 (BP Location: Right Arm, Patient Position: Sitting, Cuff Size: Large)   Pulse 96   Temp 98.4 F (36.9 C) (Oral)   Ht 5\' 10"  (1.778 m)   Wt 269 lb 6.4 oz (122.2 kg)   LMP 07/22/2023 (Exact Date)   SpO2 98%   BMI 38.65 kg/m    Physical Exam Vitals reviewed.  Constitutional:      Appearance: Normal appearance. She is well-groomed. She is morbidly obese.  HENT:     Right Ear: Tympanic membrane and ear canal normal.     Left Ear: Tympanic membrane and ear canal  normal.     Mouth/Throat:     Mouth: Mucous membranes are moist.     Pharynx: No posterior oropharyngeal erythema.  Neck:     Thyroid: No thyromegaly.  Cardiovascular:     Rate and Rhythm: Normal rate and regular rhythm.     Pulses: Normal pulses.     Heart sounds: S1 normal and S2 normal.  Pulmonary:     Effort: Pulmonary effort is normal.     Breath sounds: Normal breath sounds and air entry.  Musculoskeletal:     Right lower leg: No edema.     Left lower leg: No edema.  Lymphadenopathy:     Cervical: No cervical adenopathy.  Neurological:     Mental Status: She is alert and oriented to person, place, and time. Mental status is at baseline.     Gait: Gait is intact.  Psychiatric:        Mood and Affect: Mood and affect normal.        Speech: Speech normal.        Behavior: Behavior normal.        Judgment: Judgment normal.     No results found for any visits on 08/05/23.      Assessment & Plan:  Problem List Items Addressed This Visit       Unprioritized   Pre-diabetes   Relevant Orders   Hemoglobin A1c   Other Visit Diagnoses     Muscle cramps    -  Primary   Relevant Orders   CK   CMP   Vitamin D, 25-hydroxy   Gastroesophageal reflux disease without esophagitis       Relevant Medications   famotidine (PEPCID) 40 MG tablet   Chronic sinusitis, unspecified location       Relevant Orders   CBC with Differential/Platelets     Physical exam is benign, I would favor a diagnosis of chronic postnasal drip from her chronic allergies. This however does not explain the muscle cramps she is experiencing. I have updated her medication list to reflect the changes. I will order CBC, CMP, CK and vitamin D level to rule out most causes of muscle cramps. Not sure if the effexor would have anything to do with the muscle cramps, it is not likely. I do not think there is anything bacterial underlying. Further orders TBD depending on the results of the bloodwork today.    Meds ordered this encounter  Medications   famotidine (PEPCID) 40 MG tablet    Sig: Take 1 tablet (40 mg total) by mouth daily as needed for heartburn or indigestion.    Return in about 7 months (around 02/20/2024) for annual physical with pap smear.  Karie Georges, MD

## 2023-08-06 MED ORDER — VITAMIN D (ERGOCALCIFEROL) 1.25 MG (50000 UNIT) PO CAPS
50000.0000 [IU] | ORAL_CAPSULE | ORAL | 1 refills | Status: DC
Start: 2023-08-06 — End: 2024-02-22

## 2023-08-06 NOTE — Addendum Note (Signed)
Addended by: Karie Georges on: 08/06/2023 08:20 AM   Modules accepted: Orders

## 2024-01-06 ENCOUNTER — Encounter (HOSPITAL_COMMUNITY): Payer: Self-pay

## 2024-01-06 ENCOUNTER — Ambulatory Visit (HOSPITAL_COMMUNITY)
Admission: RE | Admit: 2024-01-06 | Discharge: 2024-01-06 | Disposition: A | Discharge status: Home / Self Care | Source: Ambulatory Visit | Attending: Physician Assistant | Admitting: Physician Assistant

## 2024-01-06 ENCOUNTER — Other Ambulatory Visit: Payer: Self-pay

## 2024-01-06 VITALS — BP 116/78 | HR 89 | Temp 98.4°F | Resp 20

## 2024-01-06 DIAGNOSIS — M7989 Other specified soft tissue disorders: Secondary | ICD-10-CM

## 2024-01-06 DIAGNOSIS — N946 Dysmenorrhea, unspecified: Secondary | ICD-10-CM

## 2024-01-06 DIAGNOSIS — M79602 Pain in left arm: Secondary | ICD-10-CM

## 2024-01-06 MED ORDER — NAPROXEN 500 MG PO TABS: 500.0000 mg | ORAL_TABLET | Freq: Two times a day (BID) | ORAL | 0 refills | Status: DC

## 2024-01-06 NOTE — Discharge Instructions (Addendum)
 Start Naprosyn  twice daily with food.  Do not take additional NSAIDs with this medication including aspirin, ibuprofen /Advil , naproxen /Aleve .  You can use Tylenol /acetaminophen  for pain relief.  I would like you to go get an ultrasound tomorrow to make sure that there is no blood clot in your arm.  Please call and schedule an appointment first thing in the morning at [phone].  If anything worsens you have increasing pain, swelling, numbness or tingling in your hand, chest pain, shortness of breath, heart racing you need to go to the emergency room immediately.  If you are unable to make an appointment for the ultrasound and you continue of symptoms I recommend going to the ER.

## 2024-01-06 NOTE — ED Triage Notes (Signed)
 Pt states that she is having LT arm pain and swelling. Pt states that she has dealt with pain and inflammation for a while. Pt states she was supposed to be in PT but she also had pericarditis and they told her to take care of her heart and then come back. Pt has not gone back to PT since. Pt has taken OTC Aleve  with a little relief.

## 2024-01-06 NOTE — ED Provider Notes (Signed)
 [facility]    CSN: [id number] Arrival date & time: 01/06/24  1512      History   Chief Complaint Chief Complaint  Patient presents with   Arm Injury    It's not really an injury but I've been having in and off inflammation in my left arm - Entered by patient   Arm Pain    HPI Colleen Maxwell is a 23 y.o. female.   Patient presents today with a several day history of worsening left arm pain and swelling.  She reports that pain is rated 3 on a 0-10 pain scale, described as aching, no aggravating relieving factors identified.  She is left-handed.  She has had intermittent numbness and paresthesias in her hands but is not currently experiencing the symptoms.  She denies any recent change in activity or known trauma.  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  She has not tried any over-the-counter medication for symptom management.  She is concerned because of the amount of swelling.  She denies any risk factors for upper extremity DVT including hospitalization, central line, IV, exogenous hormone use, active malignancy, recent COVID-19.  She did have an episode of left arm pain and swelling following COVID-19 and was referred to physical therapy but never attended physical therapy.  She has been taking Aleve  with minimal improvement in symptoms.  Denies any chest pain, palpitation, shortness of breath, lower extremity swelling or pain.      Past Medical History:  Diagnosis Date   Anxiety    Asthma    Depression    Hidradenitis suppurativa    History of pericarditis    Hydradenitis    PTSD (post-traumatic stress disorder)    Sciatica     Patient Active Problem List   Diagnosis Date Noted   Generalized abdominal pain 07/10/2022   Generalized anxiety disorder 07/10/2022   Lumbar radiculopathy 03/29/2021   Pre-diabetes 10/12/2018   Abnormal uterine bleeding 07/23/2018    Sleep disturbance 08/21/2017   Obesity 11/13/2016   Mild intermittent asthma with acute exacerbation 09/14/2015   Perennial allergic rhinitis 09/14/2015    Past Surgical History:  Procedure Laterality Date   TONSILLECTOMY      OB History   No obstetric history on file.      Home Medications    Prior to Admission medications   Medication Sig Start Date End Date Taking? Authorizing Provider  albuterol  (VENTOLIN  HFA) 108 (90 Base) MCG/ACT inhaler Inhale 2 puffs into the lungs every 4 (four) hours as needed for wheezing or shortness of breath. 07/09/22   Aida House, MD  cetirizine  (ZYRTEC  ALLERGY) 10 MG tablet Take 1 tablet (10 mg total) by mouth daily. 06/10/23   Adolph Hoop, PA-C  dicyclomine  (BENTYL ) 20 MG tablet Take 1 tablet (20 mg total) by mouth every 6 (six) hours as needed for spasms. 06/04/23   Elois Hair, MD  famotidine  (PEPCID ) 40 MG tablet Take 1 tablet (40 mg total) by mouth daily as needed for heartburn or indigestion. 08/05/23   Aida House, MD  ipratropium (ATROVENT ) 0.03 % nasal spray Place 2 sprays into both nostrils 2 (two) times daily. 06/10/23   Adolph Hoop, PA-C  naproxen  (NAPROSYN ) 500 MG tablet Take 1 tablet (500 mg total) by mouth 2 (two) times daily with a meal. 01/06/24   Enisa Runyan K, PA-C  ondansetron  (ZOFRAN -ODT) 4 MG disintegrating tablet  Take 1 tablet (4 mg total) by mouth every 8 (eight) hours as needed for nausea or vomiting. 02/19/23   Aida House, MD  Venlafaxine HCl 75 MG TB24 Take 1 tablet by mouth daily.    [provider]  Vitamin D , Ergocalciferol , (DRISDOL ) 1.25 MG (50000 UNIT) CAPS capsule Take 1 capsule (50,000 Units total) by mouth every 7 (seven) days. 08/06/23   Aida House, MD    Family History Family History  Problem Relation Age of Onset   Alcohol abuse Mother    Arthritis Mother    Hypertension Mother    Depression Mother    Healthy Mother    Healthy Father    Healthy Sister    Healthy  Brother    Healthy Brother    Hypertension Maternal Grandmother    Depression Maternal Grandmother    COPD Maternal Grandmother    Arthritis Maternal Grandmother    Lung cancer Paternal Grandmother    Colon cancer Neg Hx    Esophageal cancer Neg Hx     Social History Social History   Tobacco Use   Smoking status: Never    Passive exposure: Yes   Smokeless tobacco: Never  Vaping Use   Vaping status: Former  Substance Use Topics   Alcohol use: Not Currently   Drug use: Never     Allergies   Other   Review of Systems Review of Systems  Constitutional:  Positive for activity change. Negative for appetite change, fatigue and fever.  Respiratory:  Negative for shortness of breath.   Cardiovascular:  Negative for chest pain, palpitations and leg swelling.  Gastrointestinal:  Negative for abdominal pain, diarrhea, nausea and vomiting.  Musculoskeletal:  Positive for myalgias. Negative for arthralgias.  Skin:  Negative for color change and wound.  Neurological:  Negative for weakness and numbness.     Physical Exam Triage Vital Signs ED Triage Vitals  Encounter Vitals Group     BP 01/06/24 1605 116/78     Systolic BP Percentile --      Diastolic BP Percentile --      Pulse Rate 01/06/24 1605 89     Resp 01/06/24 1605 20     Temp 01/06/24 1605 98.4 F (36.9 C)     Temp Source 01/06/24 1605 Oral     SpO2 01/06/24 1605 97 %     Weight --      Height --      Head Circumference --      Peak Flow --      Pain Score 01/06/24 1603 2     Pain Loc --      Pain Education --      Exclude from Growth Chart --    No data found.  Updated Vital Signs BP 116/78 (BP Location: Right Arm)   Pulse 89   Temp 98.4 F (36.9 C) (Oral)   Resp 20   LMP 12/12/2023 (Exact Date)   SpO2 97%   Visual Acuity Right Eye Distance:   Left Eye Distance:   Bilateral Distance:    Right Eye Near:   Left Eye Near:    Bilateral Near:     Physical Exam Vitals reviewed.   Constitutional:      General: She is awake. She is not in acute distress.    Appearance: Normal appearance. She is well-developed. She is not ill-appearing.     Comments: Very pleasant female appear stated age in no acute distress sitting comfortably in exam room  HENT:     Head: Normocephalic and atraumatic.  Cardiovascular:     Rate and Rhythm: Normal rate and regular rhythm.     Pulses:          Radial pulses are 2+ on the left side.     Heart sounds: Normal heart sounds, S1 normal and S2 normal. No murmur heard.    Comments: Capillary refill within 2 seconds left fingers Pulmonary:     Effort: Pulmonary effort is normal.     Breath sounds: Normal breath sounds. No wheezing, rhonchi or rales.     Comments: Clear to auscultation bilaterally Musculoskeletal:     Left shoulder: No swelling, tenderness or bony tenderness. Normal range of motion. Normal strength.     Left elbow: Normal range of motion. No tenderness.     Cervical back: No tenderness or bony tenderness.     Thoracic back: No tenderness or bony tenderness.     Lumbar back: No tenderness or bony tenderness.     Comments: Left arm: No tenderness over humeral head or AC joint.  Normal active range of motion at shoulder.  Negative empty can and drop arm.  Strength 5/5 bilateral upper extremities.  No tenderness over epicondyles or olecranon on elbow.  No deformity noted.  Hand is neurovascularly intact.  Left arm is visibly larger than right arm.  Psychiatric:        Behavior: Behavior is cooperative.      UC Treatments / Results  Labs (all labs ordered are listed, but only abnormal results are displayed) Labs Reviewed - No data to display  EKG   Radiology No results found.  Procedures Procedures (including critical care time)  Medications Ordered in UC Medications - No data to display  Initial Impression / Assessment and Plan / UC Course  I have reviewed the triage vital signs and the nursing  notes.  Pertinent labs & imaging results that were available during my care of the patient were reviewed by me and considered in my medical decision making (see chart for details).     Patient is well-appearing, afebrile, nontoxic, nontachycardic.  Low suspicion for DVT PE given size difference of the left versus right arm we will obtain outpatient imaging to rule out DVT.  Unfortunately, we were unable to arrange this because patient was seen after 5 PM but she was given the contact information for central scheduling and encouraged to call them to schedule an appointment first thing tomorrow morning.  If she has any difficulty scheduling this that she is to call us  so we can help arrange this.  Discussed that I suspect more of a muscular etiology.  Plain films were deferred as she had no focal bony tenderness, deformity, and denies any recent trauma.  She was given Naprosyn  for pain relief.  Discussed that she is not to take NSAIDs with this medication and risk of GI bleeding.  She can use Tylenol  for breakthrough pain.  Recommended follow-up with sports medicine if all of her testing is normal and she continues to have significant pain and swelling.  She was given the contact information for local provider with instruction call to schedule an appointment.  We discussed that if anything worsens and she has increasing pain, swelling, redness, decreased range of motion, numbness or paresthesias in her hand, chest pain, shortness of breath, palpitations she needs to be seen emergently.  Strict return precautions given.  Excuse note provided.  Final Clinical Impressions(s) / UC Diagnoses  Final diagnoses:  Left arm pain  Left arm swelling     Discharge Instructions      Start Naprosyn  twice daily with food.  Do not take additional NSAIDs with this medication including aspirin, ibuprofen /Advil , naproxen /Aleve .  You can use Tylenol /acetaminophen  for pain relief.  I would like you to go get an  ultrasound tomorrow to make sure that there is no blood clot in your arm.  Please call and schedule an appointment first thing in the morning at [phone].  If anything worsens you have increasing pain, swelling, numbness or tingling in your hand, chest pain, shortness of breath, heart racing you need to go to the emergency room immediately.  If you are unable to make an appointment for the ultrasound and you continue of symptoms I recommend going to the ER.     ED Prescriptions     Medication Sig Dispense Auth. Provider   naproxen  (NAPROSYN ) 500 MG tablet Take 1 tablet (500 mg total) by mouth 2 (two) times daily with a meal. 30 tablet Inaara Tye K, PA-C      PDMP not reviewed this encounter.   Budd Cargo, PA-C 01/06/24 1812

## 2024-01-07 ENCOUNTER — Telehealth (HOSPITAL_COMMUNITY): Payer: Self-pay | Admitting: *Deleted

## 2024-01-07 ENCOUNTER — Ambulatory Visit (HOSPITAL_COMMUNITY)
Admission: RE | Admit: 2024-01-07 | Discharge: 2024-01-07 | Disposition: A | Discharge status: Home / Self Care | Source: Ambulatory Visit | Attending: Physician Assistant | Admitting: Physician Assistant

## 2024-01-07 DIAGNOSIS — M7989 Other specified soft tissue disorders: Secondary | ICD-10-CM

## 2024-01-07 DIAGNOSIS — M79602 Pain in left arm: Secondary | ICD-10-CM

## 2024-01-07 NOTE — Telephone Encounter (Signed)
 Sp[facility] with [facility] and the wrong extremity was ordered provider ordered a leg not arm. Order was changed to arm and they will call pt to schedule test for today.

## 2024-01-21 ENCOUNTER — Ambulatory Visit: Admitting: Sports Medicine

## 2024-01-23 ENCOUNTER — Ambulatory Visit (INDEPENDENT_AMBULATORY_CARE_PROVIDER_SITE_OTHER): Admitting: Family Medicine

## 2024-01-23 ENCOUNTER — Encounter: Payer: Self-pay | Admitting: Family Medicine

## 2024-01-23 ENCOUNTER — Other Ambulatory Visit: Payer: Self-pay

## 2024-01-23 VITALS — BP 128/84 | Ht 70.0 in | Wt 257.0 lb

## 2024-01-23 DIAGNOSIS — M25371 Other instability, right ankle: Secondary | ICD-10-CM

## 2024-01-23 DIAGNOSIS — M25522 Pain in left elbow: Secondary | ICD-10-CM

## 2024-01-23 MED ORDER — NITROGLYCERIN 0.2 MG/HR TD PT24: MEDICATED_PATCH | TRANSDERMAL | 1 refills | Status: DC

## 2024-01-23 NOTE — Progress Notes (Signed)
 PCP: Aida House, MD  SUBJECTIVE:   HPI:  Patient is a 23 y.o. female here with chief complaints of chronic left arm pain and swelling as well as recurrent right ankle sprains.   She is LHD and reports 3 years of intermittent swelling of the left elbow and pain with activity. She works at the [employer] and working with her hands will occasionally flare pain in the left elbow. 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 She was seen by a sports medicine provider in [place] and had been recommended to do PT.  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  She has also had 2 DVT scans that were negative for thrombus or significant vascular abnormality in the left upper extremity over the past few years, most recently earlier this month after an urgent care visit which prompted her referral here.  Regarding the right ankle she states that she has had multiple lateral ankle sprains over the past few years.  Most recently this was injured back in February.  She notes symptoms mild intermittent pain in the anterior aspect of the ankle with activity though it is much improved from before.  She is curious how she can mitigate this risk moving forward.  Pertinent ROS were reviewed with the patient and found to be negative unless otherwise specified above in HPI.   PERTINENT  PMH / PSH / FH / SH:  Past Medical, Surgical, Social, and Family History Reviewed & Updated in the EMR.  Pertinent findings include:  Anxiety, depression, history of post-COVID pericarditis  Allergies  Allergen Reactions   Other     Dust mites and cockroaches per allergy test    OBJECTIVE:  BP 128/84   Ht 5\' 10"  (1.778 m)   Wt 257 lb (116.6 kg)   LMP 12/12/2023 (Exact Date)   BMI 36.88 kg/m   PHYSICAL EXAM:  GEN: Alert and Oriented, NAD, comfortable in exam room RESP: Unlabored respirations, symmetric chest  rise PSY: normal mood, congruent affect   LEFT UPPER EXTREMITY MSK EXAM: No deformity, overlying skin changes, or swelling noted in the shoulder.  Mild swelling noted over the lateral aspect of her left elbow. Range of motion of the shoulder and elbow is full and painless. She is point tender over the lateral epicondyle, no tenderness over the medial epicondyle, no tenderness over the distal biceps tendon, no tenderness over the distal triceps tendon. She has mild discomfort with resisted internal and external rotation of the shoulder though strength is full.  Negative empty can testing. Strength on resisted wrist flexion and extension is full, however resisted wrist extension does reproduce pain over the lateral aspect of her elbow. Neurovascular intact distally  RIGHT FOOT/ANKLE EXAM: No visible erythema or swelling.  ROM full  strength is 5/5 in all directions Mild tenderness to palpation over ATFL.  No tenderness palpation over the lateral malleolus.  No tenderness over the medial malleolus.  No tenderness over talar dome.  No tenderness over the Achilles.  No tenderness in the midfoot or forefoot. Negative anterior drawer and talar tilt.  Negative tib-fib squeeze.   LIMITED MSK U/S LEFT ELBOW Visualization of the common extensor tendon at its attachment over the lateral epicondyle does show some hyperechoic scarring over the tendinous attachment.  No significant cortical irregularity of the lateral epicondyle.  There is also some mild hypoechoic changes in the proximal common extensor tendon.  Impression:  Chronic tendinosis of the lateral epicondyle. Ultrasound performed and interpreted by Lin Rend, MD and Violetta Grice, MD Assessment & Plan Left elbow pain Left upper extremity swelling and pain most consistent with chronic tendinosis of the lateral epicondyle.  I discussed with her the pathophysiology of this condition and management approaches.  Reassuringly has had negative scans  for DVTs in the left upper extremity less likely vascular etiology.  Plan: -Home exercise program reviewed with the patient on eccentric stretching and strengthening of the common extensor tendon -As needed anti-inflammatories and icing reviewed -Prescription for nitroglycerin patches provided, nitroglycerin protocol reviewed -Discussed the chronicity this may take many months to resolve.  Also briefly discussed additional treatment options in the future such as shockwave therapy if this is failing to improve versus formal physical therapy. -Follow-up in 8 weeks, sooner if issues arise. Ankle instability, right Recurrent ankle sprains of the right ankle without significant laxity on exam.  I did review report of her recent x-rays from 10/07/2023 which showed a small linear ossific fragment adjacent to the lateral malleolus which is indeterminate for possible small avulsion fracture, however clinically she does not have any tenderness over this spot.  Plan: -Home exercise program for ankle stability and strengthening was reviewed with the patient today. -Follow-up on this issue as needed.   Lin Rend, MD PGY-4, Sports Medicine Fellow [facility]

## 2024-01-23 NOTE — Patient Instructions (Signed)
 Your exam and ultrasound are consistent with chronic tennis elbow as we discussed.  We are going to place you on a home exercise program as well as using the nitroglycerin patch topically.  As we discussed, this will likely take several months to get totally better.  Cut patch into one - fourth pieces Place a one fourth piece of patch on  skin over affected area, changing to a new piece every 24 hours.   You may experience a headache during the first 1-2 days and maybe up to 2 weeks of using the patch; these should improve and go away. If you experience headaches after beginning nitroglycerin patch treatment, you may take your preferred over the counter pain medicine such as tylenol  or advil  as directed on the box. Another side effect of the nitroglycerin patch can be skin irritation  or rash related to the patch adhesive.   Please notify our office if you develop  severe headaches or rash, and stop the patch.  Please call our office with any questions or problems. Tendon healing with nitroglycerin patch may require 12 to 24 weeks depending on the extent of injury. Men should not use if taking Viagra, Cialis, or Levitra as it may cause an unsafe lowering of the blood pressure..  Use with caution if you have migraines or rosacea.

## 2024-02-20 ENCOUNTER — Encounter: Payer: Medicaid Other | Admitting: Family Medicine

## 2024-02-22 ENCOUNTER — Encounter (HOSPITAL_COMMUNITY): Payer: Self-pay

## 2024-02-22 ENCOUNTER — Ambulatory Visit (HOSPITAL_COMMUNITY)
Admission: RE | Admit: 2024-02-22 | Discharge: 2024-02-22 | Disposition: A | Discharge status: Home / Self Care | Source: Ambulatory Visit | Attending: Emergency Medicine | Admitting: Emergency Medicine

## 2024-02-22 VITALS — BP 123/91 | HR 87 | Temp 98.8°F | Resp 16

## 2024-02-22 DIAGNOSIS — R197 Diarrhea, unspecified: Secondary | ICD-10-CM

## 2024-02-22 DIAGNOSIS — F509 Eating disorder, unspecified: Secondary | ICD-10-CM | POA: Diagnosis not present

## 2024-02-22 DIAGNOSIS — K529 Noninfective gastroenteritis and colitis, unspecified: Secondary | ICD-10-CM | POA: Insufficient documentation

## 2024-02-22 DIAGNOSIS — Z87891 Personal history of nicotine dependence: Secondary | ICD-10-CM | POA: Diagnosis not present

## 2024-02-22 DIAGNOSIS — K589 Irritable bowel syndrome without diarrhea: Secondary | ICD-10-CM | POA: Diagnosis not present

## 2024-02-22 LAB — COMPREHENSIVE METABOLIC PANEL WITH GFR
ALT: 14 U/L (ref 0–44)
AST: 22 U/L (ref 15–41)
Albumin: 3.9 g/dL (ref 3.5–5.0)
Alkaline Phosphatase: 64 U/L (ref 38–126)
Anion gap: 10 (ref 5–15)
BUN: 10 mg/dL (ref 6–20)
CO2: 24 mmol/L (ref 22–32)
Calcium: 9.3 mg/dL (ref 8.9–10.3)
Chloride: 106 mmol/L (ref 98–111)
Creatinine, Ser: 0.69 mg/dL (ref 0.44–1.00)
GFR, Estimated: >60 mL/min (ref >60)
Glucose, Bld: 80 mg/dL (ref 70–99)
Potassium: 3.9 mmol/L (ref 3.5–5.1)
Sodium: 140 mmol/L (ref 135–145)
Total Bilirubin: 0.3 mg/dL (ref 0.0–1.2)
Total Protein: 7 g/dL (ref 6.5–8.1)

## 2024-02-22 LAB — LIPASE, BLOOD: Lipase: 27 U/L (ref 11–51)

## 2024-02-22 LAB — CBC
HCT: 38.9 % (ref 36.0–46.0)
Hemoglobin: 12.5 g/dL (ref 12.0–15.0)
MCH: 28.3 pg (ref 26.0–34.0)
MCHC: 32.1 g/dL (ref 30.0–36.0)
MCV: 88 fL (ref 80.0–100.0)
Platelets: 327 10*3/uL (ref 150–400)
RBC: 4.42 MIL/uL (ref 3.87–5.11)
RDW: 13 % (ref 11.5–15.5)
WBC: 7 10*3/uL (ref 4.0–10.5)
nRBC: 0 % (ref 0.0–0.2)

## 2024-02-22 MED ORDER — LOPERAMIDE HCL 2 MG PO CAPS: 2.0000 mg | ORAL_CAPSULE | Freq: Four times a day (QID) | ORAL | 0 refills | Status: DC | PRN

## 2024-02-22 MED ORDER — FAMOTIDINE 20 MG PO TABS: 20.0000 mg | ORAL_TABLET | Freq: Two times a day (BID) | ORAL | 0 refills | Status: DC | PRN

## 2024-02-22 NOTE — ED Provider Notes (Signed)
 [facility]    CSN: [id number] Arrival date & time: 02/22/24  1619      History   Chief Complaint Chief Complaint  Patient presents with   Abdominal Pain    HPI Colleen Maxwell is a 23 y.o. female.   Patient presents with intermittent abdominal pain, diarrhea, loss of appetite, nausea, and indigestion for about a day and a half.  Patient states that she has had multiple episodes of diarrhea over the last 24 hours, and states that she lost count of how many times.   Patient states that she has had symptoms like this before and is concerned that she may have undiagnosed abdominal issues.  Patient states that she went to [facility] this morning and they told her that she has IBS and did not prescribe her any medication at that time nor provided her any follow-up.  Denies vomiting, blood in stool, fever, urinary symptoms, body aches, chills, cough, congestion, chest pain, and shortness of breath.  Patient states that she has not taking any medication for her symptoms.  Patient states that she did see a gastroenterologist in the past and they told her that she had an eating disorder and she states that they are very dismissive of her symptoms.  Patient states that she has also been tested in the past multiple times for celiac disease, but states that she has been told that she does not have this.  Patient states that sometimes when she eats gluten she notices that the symptoms will occur.  Patient states that she has stopped eating gluten for a few months now because of this.  The history is provided by the patient and medical records.  Abdominal Pain   Past Medical History:  Diagnosis Date   Anxiety    Asthma    Depression    Hidradenitis suppurativa    History of pericarditis    Hydradenitis    PTSD (post-traumatic stress disorder)    Sciatica     Patient Active Problem List   Diagnosis Date Noted   Generalized abdominal pain 07/10/2022    Generalized anxiety disorder 07/10/2022   Lumbar radiculopathy 03/29/2021   Pre-diabetes 10/12/2018   Abnormal uterine bleeding 07/23/2018   Sleep disturbance 08/21/2017   Obesity 11/13/2016   Mild intermittent asthma with acute exacerbation 09/14/2015   Perennial allergic rhinitis 09/14/2015    Past Surgical History:  Procedure Laterality Date   TONSILLECTOMY      OB History   No obstetric history on file.      Home Medications    Prior to Admission medications   Medication Sig Start Date End Date Taking? Authorizing Provider  famotidine  (PEPCID ) 20 MG tablet Take 1 tablet (20 mg total) by mouth 2 (two) times daily as needed for heartburn or indigestion. 02/22/24  Yes Johnie, Dayle Sherpa A, NP  hydrOXYzine (VISTARIL) 25 MG capsule Take 25 mg by mouth every 6 (six) hours as needed. 10/21/23  Yes [provider]  loperamide (IMODIUM) 2 MG capsule Take 1 capsule (2 mg total) by mouth 4 (four) times daily as needed for diarrhea or loose stools. 02/22/24  Yes Johnie Flaming A, NP  methylphenidate 27 MG PO CR tablet Take 27 mg by mouth every morning. 02/17/24  Yes [provider]  albuterol  (VENTOLIN  HFA) 108 (90 Base) MCG/ACT inhaler Inhale 2 puffs into the lungs every 4 (four) hours as needed for wheezing or shortness of breath. 07/09/22   Ozell Heron HERO, MD  cetirizine  (ZYRTEC  ALLERGY)  10 MG tablet Take 1 tablet (10 mg total) by mouth daily. Patient not taking: Reported on 02/22/2024 06/10/23   Christopher Savannah, PA-C  dicyclomine  (BENTYL ) 20 MG tablet Take 1 tablet (20 mg total) by mouth every 6 (six) hours as needed for spasms. Patient not taking: Reported on 02/22/2024 06/04/23   Stacia Glendia BRAVO, MD  ipratropium (ATROVENT ) 0.03 % nasal spray Place 2 sprays into both nostrils 2 (two) times daily. 06/10/23   Christopher Savannah, PA-C  naproxen  (NAPROSYN ) 500 MG tablet Take 1 tablet (500 mg total) by mouth 2 (two) times daily with a meal. 01/06/24   Raspet, Erin K, PA-C   nitroGLYCERIN  (NITRODUR - DOSED IN MG/24 HR) 0.2 mg/hr patch Use 1/4 patch daily to the affected area. 01/23/24   Rosalynn Camie CROME, MD  Venlafaxine HCl 150 MG TB24 Take 1 tablet by mouth daily.    [provider]  venlafaxine XR (EFFEXOR-XR) 37.5 MG 24 hr capsule Take 37.5 mg by mouth daily.    [provider]    Family History Family History  Problem Relation Age of Onset   Alcohol abuse Mother    Arthritis Mother    Hypertension Mother    Depression Mother    Healthy Mother    Healthy Father    Healthy Sister    Healthy Brother    Healthy Brother    Hypertension Maternal Grandmother    Depression Maternal Grandmother    COPD Maternal Grandmother    Arthritis Maternal Grandmother    Lung cancer Paternal Grandmother    Colon cancer Neg Hx    Esophageal cancer Neg Hx     Social History Social History   Tobacco Use   Smoking status: Never    Passive exposure: Yes   Smokeless tobacco: Never  Vaping Use   Vaping status: Former  Substance Use Topics   Alcohol use: Not Currently   Drug use: Never     Allergies   Other   Review of Systems Review of Systems  Gastrointestinal:  Positive for abdominal pain.   Per HPI  Physical Exam Triage Vital Signs ED Triage Vitals  Encounter Vitals Group     BP 02/22/24 1646 (!) 123/91     Girls Systolic BP Percentile --      Girls Diastolic BP Percentile --      Boys Systolic BP Percentile --      Boys Diastolic BP Percentile --      Pulse Rate 02/22/24 1646 87     Resp 02/22/24 1646 16     Temp 02/22/24 1646 98.8 F (37.1 C)     Temp Source 02/22/24 1646 Oral     SpO2 02/22/24 1646 96 %     Weight --      Height --      Head Circumference --      Peak Flow --      Pain Score 02/22/24 1648 4     Pain Loc --      Pain Education --      Exclude from Growth Chart --    No data found.  Updated Vital Signs BP (!) 123/91 (BP Location: Left Arm)   Pulse 87   Temp 98.8 F (37.1 C) (Oral)   Resp 16    LMP 02/15/2024 (Approximate)   SpO2 96%   Visual Acuity Right Eye Distance:   Left Eye Distance:   Bilateral Distance:    Right Eye Near:   Left Eye Near:  Bilateral Near:     Physical Exam Vitals and nursing note reviewed.  Constitutional:      General: She is awake. She is not in acute distress.    Appearance: Normal appearance. She is well-developed and well-groomed. She is not ill-appearing.   Cardiovascular:     Rate and Rhythm: Normal rate and regular rhythm.  Pulmonary:     Effort: Pulmonary effort is normal.     Breath sounds: Normal breath sounds.  Abdominal:     General: Abdomen is protuberant. There is no distension.     Palpations: Abdomen is soft. There is no mass.     Tenderness: There is abdominal tenderness in the epigastric area and periumbilical area. There is no right CVA tenderness, left CVA tenderness, guarding or rebound.     Hernia: No hernia is present.   Skin:    General: Skin is warm and dry.   Neurological:     Mental Status: She is alert.   Psychiatric:        Behavior: Behavior is cooperative.      UC Treatments / Results  Labs (all labs ordered are listed, but only abnormal results are displayed) Labs Reviewed  CBC  COMPREHENSIVE METABOLIC PANEL WITH GFR  LIPASE, BLOOD    EKG   Radiology No results found.  Procedures Procedures (including critical care time)  Medications Ordered in UC Medications - No data to display  Initial Impression / Assessment and Plan / UC Course  I have reviewed the triage vital signs and the nursing notes.  Pertinent labs & imaging results that were available during my care of the patient were reviewed by me and considered in my medical decision making (see chart for details).     Patient is overall well-appearing.  Vitals are stable.  Upon assessment there is mild tenderness noted to epigastric and periumbilical regions.  No other significant findings upon exam.  Ordered CBC, CMP, and  lipase to evaluate for underlying causes related to symptoms and rule out electrolyte imbalance due to amount of diarrhea over the last 24 hours.  Prescribed Imodium as needed for diarrhea.  Prescribed Pepcid  as needed for indigestion.  Discussed follow-up, return, and strict ER precautions. Final Clinical Impressions(s) / UC Diagnoses   Final diagnoses:  Gastroenteritis  Diarrhea, unspecified type     Discharge Instructions      As a as it is difficult to give you a definitive diagnosis related to your symptoms today. We have drawn some lab work today to evaluate for infection, electrolyte imbalance, or  other underlying causes for your symptoms. You can take Imodium 4 times daily as needed for diarrhea. You can take Pepcid  twice daily as needed for indigestion or heartburn. Make sure you are staying hydrated with water or electrolyte drinks. Eat foods high in fiber including fruit, vegetables, and whole grains. Follow-up with your primary care provider or return here as needed. If you develop severe abdominal pain, excessive vomiting, excessive diarrhea, blood in vomit or stool, fevers unrelieved by medication, or passing out please seek immediate medical treatment in the emergency department.    ED Prescriptions     Medication Sig Dispense Auth. Provider   loperamide (IMODIUM) 2 MG capsule Take 1 capsule (2 mg total) by mouth 4 (four) times daily as needed for diarrhea or loose stools. 12 capsule Johnie Flaming A, NP   famotidine  (PEPCID ) 20 MG tablet Take 1 tablet (20 mg total) by mouth 2 (two) times daily as needed for  heartburn or indigestion. 30 tablet Johnie Flaming A, NP      PDMP not reviewed this encounter.   Johnie Flaming A, NP 02/22/24 1743

## 2024-02-22 NOTE — Discharge Instructions (Signed)
 As a as it is difficult to give you a definitive diagnosis related to your symptoms today. We have drawn some lab work today to evaluate for infection, electrolyte imbalance, or  other underlying causes for your symptoms. You can take Imodium 4 times daily as needed for diarrhea. You can take Pepcid  twice daily as needed for indigestion or heartburn. Make sure you are staying hydrated with water or electrolyte drinks. Eat foods high in fiber including fruit, vegetables, and whole grains. Follow-up with your primary care provider or return here as needed. If you develop severe abdominal pain, excessive vomiting, excessive diarrhea, blood in vomit or stool, fevers unrelieved by medication, or passing out please seek immediate medical treatment in the emergency department.

## 2024-02-22 NOTE — ED Triage Notes (Signed)
 Patient here today with c/o abd pain, diarrhea, loss of appetite, nausea, and vomiting X 1.5 days.

## 2024-02-23 ENCOUNTER — Ambulatory Visit (HOSPITAL_COMMUNITY): Payer: Self-pay

## 2024-04-19 ENCOUNTER — Other Ambulatory Visit: Payer: Self-pay | Admitting: Family Medicine

## 2024-04-19 DIAGNOSIS — J4520 Mild intermittent asthma, uncomplicated: Secondary | ICD-10-CM

## 2024-04-19 MED ORDER — ALBUTEROL SULFATE HFA 108 (90 BASE) MCG/ACT IN AERS: 2.0000 | INHALATION_SPRAY | RESPIRATORY_TRACT | 0 refills | Status: DC | PRN

## 2024-04-19 NOTE — Telephone Encounter (Signed)
 Copied from CRM #[id number]. Topic: Clinical - Medication Refill >> Apr 19, 2024  8:38 AM Macario HERO wrote: Medication: albuterol  (VENTOLIN  HFA) 108 (90 Base) MCG/ACT inhaler [[id number]]  Has the patient contacted their pharmacy? Yes (Agent: If no, request that the patient contact the pharmacy for the refill. If patient does not wish to contact the pharmacy document the reason why and proceed with request.) (Agent: If yes, when and what did the pharmacy advise?)  This is the patient's preferred pharmacy:  [id number] [place], [place] - [address] AT [facility] [address] [facility] [address] [place] [address] Phone: [phone] Fax: [phone]  Is this the correct pharmacy for this prescription? Yes If no, delete pharmacy and type the correct one.   Has the prescription been filled recently? No  Is the patient out of the medication? Yes  Has the patient been seen for an appointment in the last year OR does the patient have an upcoming appointment? Yes  Can we respond through MyChart? Yes  Agent: Please be advised that Rx refills may take up to 3 business days. We ask that you follow-up with your pharmacy.

## 2024-05-04 ENCOUNTER — Other Ambulatory Visit: Payer: Self-pay | Admitting: Family Medicine

## 2024-05-04 DIAGNOSIS — J4520 Mild intermittent asthma, uncomplicated: Secondary | ICD-10-CM

## 2024-05-25 ENCOUNTER — Ambulatory Visit (HOSPITAL_COMMUNITY)
Admission: RE | Admit: 2024-05-25 | Discharge: 2024-05-25 | Disposition: A | Discharge status: Home / Self Care | Source: Ambulatory Visit | Attending: Family Medicine | Admitting: Family Medicine

## 2024-05-25 ENCOUNTER — Encounter (HOSPITAL_COMMUNITY): Payer: Self-pay

## 2024-05-25 VITALS — BP 114/82 | HR 103 | Temp 98.2°F | Resp 18

## 2024-05-25 DIAGNOSIS — F419 Anxiety disorder, unspecified: Secondary | ICD-10-CM | POA: Diagnosis not present

## 2024-05-25 DIAGNOSIS — Z77120 Contact with and (suspected) exposure to mold (toxic): Secondary | ICD-10-CM | POA: Diagnosis not present

## 2024-05-25 DIAGNOSIS — K589 Irritable bowel syndrome without diarrhea: Secondary | ICD-10-CM

## 2024-05-25 DIAGNOSIS — Z5919 Other inadequate housing: Secondary | ICD-10-CM

## 2024-05-25 NOTE — ED Triage Notes (Signed)
 Pt states after she eats X 1.5 weeks she has diarrhea. She has been trying to do dairy free to see if it helps but it has not. She has some cramping in her abd wihen she has a BM  Pt states she has some SOB x 2 weeks on and off she thinks is related to mold in her apartment. She is using [facility]I sometimes but not regularly.

## 2024-05-25 NOTE — Discharge Instructions (Signed)
 Take home meds as prescribed Avoid known or possible triggers Take daily allergy med of your choice Follow up with environmental services regarding possible mold growth in home Avoid caffeine,spicy,greasy fried foods Keep appt Friday with PCP/ may need referral to GI for further evaluation of IBS-call for appt

## 2024-05-25 NOTE — ED Provider Notes (Signed)
 [facility]    CSN: [id number] Arrival date & time: 05/25/24  1514      History   Chief Complaint Chief Complaint  Patient presents with   Diarrhea    Entered by patient   Shortness of Breath    HPI Colleen Maxwell is a 23 y.o. female.   23 year old female, Colleen Maxwell, presents to urgent care for evaluation of diarrhea after eating x 1.5 weeks .  Patient reports she has been seen by GI in the past but has not followed up recently , patient reports she has diarrhea after eating no matter what she eats .  Patient also complaining of shortness of breath x 2 weeks due to mold in my apartment.  The history is provided by the patient. No language interpreter was used.    Past Medical History:  Diagnosis Date   Anxiety    Asthma    Depression    Hidradenitis suppurativa    History of pericarditis    Hydradenitis    PTSD (post-traumatic stress disorder)    Sciatica     Patient Active Problem List   Diagnosis Date Noted   Irritable bowel syndrome 05/25/2024   Anxiety 05/25/2024   Mold growth in home 05/25/2024   Generalized abdominal pain 07/10/2022   Generalized anxiety disorder 07/10/2022   Lumbar radiculopathy 03/29/2021   Pre-diabetes 10/12/2018   Abnormal uterine bleeding 07/23/2018   Sleep disturbance 08/21/2017   Obesity 11/13/2016   Mild intermittent asthma with acute exacerbation 09/14/2015   Perennial allergic rhinitis 09/14/2015    Past Surgical History:  Procedure Laterality Date   TONSILLECTOMY      OB History   No obstetric history on file.      Home Medications    Prior to Admission medications   Medication Sig Start Date End Date Taking? Authorizing Provider  albuterol  (VENTOLIN  HFA) 108 (90 Base) MCG/ACT inhaler INHALE 2 PUFFS INTO THE LUNGS EVERY 4 HOURS AS NEEDED FOR WHEEZING OR SHORTNESS OF BREATH 05/05/24  Yes Ozell Heron HERO, MD  methylphenidate 27 MG PO CR tablet Take 27 mg by mouth every morning.  02/17/24  Yes [provider]  Venlafaxine HCl 150 MG TB24 Take 1 tablet by mouth daily.   Yes [provider]  cetirizine  (ZYRTEC  ALLERGY) 10 MG tablet Take 1 tablet (10 mg total) by mouth daily. Patient not taking: Reported on 02/22/2024 06/10/23   Christopher Savannah, PA-C  dicyclomine  (BENTYL ) 20 MG tablet Take 1 tablet (20 mg total) by mouth every 6 (six) hours as needed for spasms. Patient not taking: Reported on 02/22/2024 06/04/23   Stacia Glendia BRAVO, MD  famotidine  (PEPCID ) 20 MG tablet Take 1 tablet (20 mg total) by mouth 2 (two) times daily as needed for heartburn or indigestion. 02/22/24   Johnie Flaming A, NP  hydrOXYzine (VISTARIL) 25 MG capsule Take 25 mg by mouth every 6 (six) hours as needed. 10/21/23   [provider]  ipratropium (ATROVENT ) 0.03 % nasal spray Place 2 sprays into both nostrils 2 (two) times daily. 06/10/23   Christopher Savannah, PA-C  loperamide  (IMODIUM ) 2 MG capsule Take 1 capsule (2 mg total) by mouth 4 (four) times daily as needed for diarrhea or loose stools. 02/22/24   Johnie Flaming A, NP  naproxen  (NAPROSYN ) 500 MG tablet Take 1 tablet (500 mg total) by mouth 2 (two) times daily with a meal. 01/06/24   Raspet, Erin K, PA-C  nitroGLYCERIN  (NITRODUR - DOSED IN MG/24 HR)  0.2 mg/hr patch Use 1/4 patch daily to the affected area. 01/23/24   Rosalynn Camie CROME, MD  venlafaxine XR (EFFEXOR-XR) 37.5 MG 24 hr capsule Take 37.5 mg by mouth daily.    [provider]    Family History Family History  Problem Relation Age of Onset   Alcohol abuse Mother    Arthritis Mother    Hypertension Mother    Depression Mother    Healthy Mother    Healthy Father    Healthy Sister    Healthy Brother    Healthy Brother    Hypertension Maternal Grandmother    Depression Maternal Grandmother    COPD Maternal Grandmother    Arthritis Maternal Grandmother    Lung cancer Paternal Grandmother    Colon cancer Neg Hx    Esophageal cancer Neg Hx     Social  History Social History   Tobacco Use   Smoking status: Never    Passive exposure: Yes   Smokeless tobacco: Never  Vaping Use   Vaping status: Former  Substance Use Topics   Alcohol use: Not Currently   Drug use: Never     Allergies   Dust mite extract and Other   Review of Systems Review of Systems  Constitutional:  Negative for fever.  HENT:  Negative for congestion.   Respiratory:  Positive for shortness of breath. Negative for cough, chest tightness, wheezing and stridor.   Gastrointestinal:  Positive for diarrhea. Negative for nausea and vomiting.       Abdominal cramping  Psychiatric/Behavioral:  The patient is nervous/anxious.   All other systems reviewed and are negative.    Physical Exam Triage Vital Signs ED Triage Vitals [05/25/24 1537]  Encounter Vitals Group     BP      Girls Systolic BP Percentile      Girls Diastolic BP Percentile      Boys Systolic BP Percentile      Boys Diastolic BP Percentile      Pulse      Resp      Temp      Temp src      SpO2      Weight      Height      Head Circumference      Peak Flow      Pain Score 2     Pain Loc      Pain Education      Exclude from Growth Chart    No data found.  Updated Vital Signs BP 114/82 (BP Location: Right Arm)   Pulse (!) 103   Temp 98.2 F (36.8 C) (Oral)   Resp 18   LMP 05/11/2024 (Approximate)   SpO2 98%   Visual Acuity Right Eye Distance:   Left Eye Distance:   Bilateral Distance:    Right Eye Near:   Left Eye Near:    Bilateral Near:     Physical Exam Vitals and nursing note reviewed.  Constitutional:      Appearance: She is well-developed and well-groomed. She is obese.  Cardiovascular:     Rate and Rhythm: Normal rate and regular rhythm.     Heart sounds: Normal heart sounds.  Pulmonary:     Effort: Pulmonary effort is normal.     Breath sounds: Normal breath sounds and air entry.  Abdominal:     General: Bowel sounds are normal.     Palpations: Abdomen is  soft.     Tenderness: There is no abdominal  tenderness.  Neurological:     Mental Status: She is alert.  Psychiatric:        Behavior: Behavior is cooperative.      UC Treatments / Results  Labs (all labs ordered are listed, but only abnormal results are displayed) Labs Reviewed - No data to display  EKG   Radiology No results found.  Procedures Procedures (including critical care time)  Medications Ordered in UC Medications - No data to display  Initial Impression / Assessment and Plan / UC Course  I have reviewed the triage vital signs and the nursing notes.  Pertinent labs & imaging results that were available during my care of the patient were reviewed by me and considered in my medical decision making (see chart for details).    Discussed exam findings and plan of care with patient, urged patient to keep appointment this week with PCP , follow-up with GI , food journal /symptom journal ,strict go to ER precautions given.   Patient verbalized understanding to this provider.  Ddx: IBS, Food allergies, Mold growth in home(reported), allergiesanxiety Final Clinical Impressions(s) / UC Diagnoses   Final diagnoses:  Irritable bowel syndrome, unspecified type  Anxiety  Mold growth in home     Discharge Instructions      Take home meds as prescribed Avoid known or possible triggers Take daily allergy med of your choice Follow up with environmental services regarding possible mold growth in home Avoid caffeine,spicy,greasy fried foods Keep appt Friday with PCP/ may need referral to GI for further evaluation of IBS-call for appt     ED Prescriptions   None    PDMP not reviewed this encounter.   Aminta Loose, NP 05/25/24 2122

## 2024-08-04 ENCOUNTER — Other Ambulatory Visit (HOSPITAL_COMMUNITY)
Admission: RE | Admit: 2024-08-04 | Discharge: 2024-08-04 | Disposition: A | Discharge status: Home / Self Care | Source: Ambulatory Visit | Attending: Family Medicine | Admitting: Family Medicine

## 2024-08-04 ENCOUNTER — Other Ambulatory Visit: Payer: Self-pay

## 2024-08-04 ENCOUNTER — Ambulatory Visit: Admitting: Family Medicine

## 2024-08-04 VITALS — BP 121/86 | HR 85 | Wt 253.5 lb

## 2024-08-04 DIAGNOSIS — N939 Abnormal uterine and vaginal bleeding, unspecified: Secondary | ICD-10-CM

## 2024-08-04 DIAGNOSIS — Z124 Encounter for screening for malignant neoplasm of cervix: Secondary | ICD-10-CM

## 2024-08-04 DIAGNOSIS — R5383 Other fatigue: Secondary | ICD-10-CM

## 2024-08-05 DIAGNOSIS — Z124 Encounter for screening for malignant neoplasm of cervix: Secondary | ICD-10-CM | POA: Insufficient documentation

## 2024-08-05 DIAGNOSIS — R5383 Other fatigue: Secondary | ICD-10-CM | POA: Insufficient documentation

## 2024-08-05 LAB — CBC
Hematocrit: 38.7 % (ref 34.0–46.6)
Hemoglobin: 12.7 g/dL (ref 11.1–15.9)
MCH: 29.1 pg (ref 26.6–33.0)
MCHC: 32.8 g/dL (ref 31.5–35.7)
MCV: 89 fL (ref 79–97)
Platelets: 290 x10E3/uL (ref 150–450)
RBC: 4.36 x10E6/uL (ref 3.77–5.28)
RDW: 13.1 % (ref 11.7–15.4)
WBC: 9.4 x10E3/uL (ref 3.4–10.8)

## 2024-08-05 LAB — TSH: TSH: 1.06 u[IU]/mL (ref 0.450–4.500)

## 2024-08-05 LAB — VITAMIN D 25 HYDROXY (VIT D DEFICIENCY, FRACTURES): Vit D, 25-Hydroxy: 22.5 ng/mL — ABNORMAL LOW (ref 30.0–100.0)

## 2024-08-05 NOTE — Progress Notes (Signed)
 Subjective:  Colleen Maxwell is a 23 y.o. female who presents to the clinic today for evaluation for concern for PCOS.  She also needs a Pap smear  HPI: Concern for PCOS Abnormal uterine bleeding Patient reports that she has history of abnormal uterine bleeding throughout her life.  She reports she was told she may have PCOS due to her hirsutism and abnormal periods.  Per chart review patient was seen in our clinic in 2020 for secondary amenorrhea.  In 2019 she had a full workup which overall was normal.  She was started on birth control pills but did not like them.  She also had someone recommend metformin  for the prediabetes but patient did not like the side effects of this.  She reports that her menses are regular at this time but she does have heavy bleeding usually on day 2 and will pass a relatively large clot but then the bleeding improves.  Patient reports along with this abnormal bleeding she also has considerable fatigue.  Pap smear Patient in need of Pap smear today. Objective:  Physical Exam: BP 121/86   Pulse 85   Wt 253 lb 8 oz (115 kg)   LMP 07/20/2024 (Approximate)   BMI 36.37 kg/m   Gen: Alert, well-appearing, no acute distress CV: Regular rate and rhythm, no murmurs appreciated Pulm: Normal work of breathing, speaking in full sentences GI: Soft, nontender GU: Normal external female genitalia, normal vaginal tissue, normal-appearing cervix MSK: no edema, cyanosis, or clubbing noted Skin: warm, dry Neuro: grossly normal, moves all extremities Psych: Normal affect and thought content  Results for orders placed or performed in visit on 08/04/24 (from the past 72 hours)  CBC     Status: None   Collection Time: 08/04/24  5:05 PM  Result Value Ref Range   WBC 9.4 3.4 - 10.8 x10E3/uL   RBC 4.36 3.77 - 5.28 x10E6/uL   Hemoglobin 12.7 11.1 - 15.9 g/dL   Hematocrit 61.2 65.9 - 46.6 %   MCV 89 79 - 97 fL   MCH 29.1 26.6 - 33.0 pg   MCHC 32.8 31.5 - 35.7  g/dL   RDW 86.8 88.2 - 84.5 %   Platelets 290 150 - 450 x10E3/uL  TSH     Status: None   Collection Time: 08/04/24  5:05 PM  Result Value Ref Range   TSH 1.060 0.450 - 4.500 uIU/mL  Vitamin D  (25 hydroxy)     Status: Abnormal   Collection Time: 08/04/24  5:05 PM  Result Value Ref Range   Vit D, 25-Hydroxy 22.5 (L) 30.0 - 100.0 ng/mL    Comment: Vitamin D  deficiency has been defined by the Institute of Medicine and an Endocrine Society practice guideline as a level of serum 25-OH vitamin D  less than 20 ng/mL (1,2). The Endocrine Society went on to further define vitamin D  insufficiency as a level between 21 and 29 ng/mL (2). 1. IOM (Institute of Medicine). 2010. Dietary reference    intakes for calcium and D. [place]: The    [employer]. 2. Holick MF, Binkley [place], Bischoff-Ferrari HA, et al.    Evaluation, treatment, and prevention of vitamin D     deficiency: an Endocrine Society clinical practice    guideline. JCEM. 2011 Jul; 96(7):1911-30.      Assessment/Plan:  Encounter for Papanicolaou smear of cervix Pap smear completed without difficulty  Fatigue Worsening fatigue.  Will check CBC, TSH, vitamin D .  Patient with history of abnormally low vitamin  D.  Abnormal uterine bleeding Patient previously on OCPs but does not want to continue these.  Is having regular periods at this time but having heavy menses.  Discussed use of OCPs to help treat heavy bleeding as well as discussed IUD.  Patient not interested in either of these.  Will continue to monitor at this time but will check CBC to make sure she is not anemic.  Also discussed the use of metformin  to help with her prediabetes and patient reports she cannot tolerate the side effects from the metformin .  Discussed switching to the XR version which she will consider.   Lab Orders         CBC         TSH         Vitamin D  (25 hydroxy)     No orders of the defined types were placed in this  encounter.     Steffan Rover, MD Attending Family Medicine Physician, [facility], [facility]   08/05/24 4:29 PM

## 2024-08-05 NOTE — Assessment & Plan Note (Signed)
 Worsening fatigue.  Will check CBC, TSH, vitamin D .  Patient with history of abnormally low vitamin D .

## 2024-08-05 NOTE — Assessment & Plan Note (Signed)
 Pap smear completed without difficulty

## 2024-08-05 NOTE — Assessment & Plan Note (Addendum)
 Patient previously on OCPs but does not want to continue these.  Is having regular periods at this time but having heavy menses.  Discussed use of OCPs to help treat heavy bleeding as well as discussed IUD.  Patient not interested in either of these.  Will continue to monitor at this time but will check CBC to make sure she is not anemic.  Also discussed the use of metformin  to help with her prediabetes and patient reports she cannot tolerate the side effects from the metformin .  Discussed switching to the XR version which she will consider.

## 2024-08-06 LAB — CYTOLOGY - PAP
Adequacy: ABSENT
Diagnosis: NEGATIVE

## 2024-08-07 ENCOUNTER — Encounter (HOSPITAL_COMMUNITY): Payer: Self-pay

## 2024-08-07 ENCOUNTER — Ambulatory Visit (HOSPITAL_COMMUNITY)
Admission: RE | Admit: 2024-08-07 | Discharge: 2024-08-07 | Disposition: A | Discharge status: Home / Self Care | Source: Ambulatory Visit | Attending: Emergency Medicine

## 2024-08-07 ENCOUNTER — Other Ambulatory Visit: Payer: Self-pay

## 2024-08-07 VITALS — BP 126/83 | HR 87 | Temp 98.4°F | Resp 20

## 2024-08-07 DIAGNOSIS — R350 Frequency of micturition: Secondary | ICD-10-CM | POA: Diagnosis not present

## 2024-08-07 DIAGNOSIS — Z3202 Encounter for pregnancy test, result negative: Secondary | ICD-10-CM

## 2024-08-07 LAB — POCT URINE PREGNANCY: Preg Test, Ur: NEGATIVE

## 2024-08-07 LAB — POCT URINE DIPSTICK
Bilirubin, UA: NEGATIVE
Blood, UA: NEGATIVE
Glucose, UA: NEGATIVE mg/dL
Leukocytes, UA: NEGATIVE
Nitrite, UA: NEGATIVE
POC PROTEIN,UA: NEGATIVE
Spec Grav, UA: 1.025 (ref 1.010–1.025)
Urobilinogen, UA: 0.2 U/dL
pH, UA: 5.5 (ref 5.0–8.0)

## 2024-08-07 NOTE — ED Provider Notes (Signed)
 [facility]    CSN: [id number] Arrival date & time: 08/07/24  1609      History   Chief Complaint Chief Complaint  Patient presents with   Urinary Frequency    Entered by patient   Abdominal Pain   Headache    HPI Colleen Maxwell is a 23 y.o. female.   Patient presents to clinic over concern of urinary frequency for the past 3 days.  She is also having left lower quadrant abdominal pain that has been ongoing.  Reports she has been tested multiple times for celiac and is tested negative.  Does have a GI appointment this upcoming week.  Has not had hematuria or dysuria.  Denies nausea or vomiting.  Without fevers or flank pain.  Reports previous UTIs have presented similarly with the urinary frequency.  Does not really drink caffeine.  The history is provided by the patient and medical records.  Urinary Frequency  Abdominal Pain Headache   Past Medical History:  Diagnosis Date   Anxiety    Asthma    Depression    Hidradenitis suppurativa    History of pericarditis    Hydradenitis    PTSD (post-traumatic stress disorder)    Sciatica     Patient Active Problem List   Diagnosis Date Noted   Encounter for Papanicolaou smear of cervix 08/05/2024   Fatigue 08/05/2024   Irritable bowel syndrome 05/25/2024   Anxiety 05/25/2024   Mold growth in home 05/25/2024   Generalized abdominal pain 07/10/2022   Generalized anxiety disorder 07/10/2022   Lumbar radiculopathy 03/29/2021   Pre-diabetes 10/12/2018   Abnormal uterine bleeding 07/23/2018   Sleep disturbance 08/21/2017   Obesity 11/13/2016   Mild intermittent asthma with acute exacerbation 09/14/2015   Perennial allergic rhinitis 09/14/2015    Past Surgical History:  Procedure Laterality Date   TONSILLECTOMY      OB History   No obstetric history on file.      Home Medications    Prior to Admission medications   Medication Sig Start Date End Date Taking? Authorizing Provider   albuterol  (VENTOLIN  HFA) 108 (90 Base) MCG/ACT inhaler INHALE 2 PUFFS INTO THE LUNGS EVERY 4 HOURS AS NEEDED FOR WHEEZING OR SHORTNESS OF BREATH 05/05/24  Yes Ozell Heron HERO, MD  Vitamin D , Ergocalciferol , (DRISDOL ) 1.25 MG (50000 UNIT) CAPS capsule Take 50,000 Units by mouth once a week. 07/09/24  Yes [provider]  cetirizine  (ZYRTEC  ALLERGY) 10 MG tablet Take 1 tablet (10 mg total) by mouth daily. Patient not taking: Reported on 08/04/2024 06/10/23   Christopher Savannah, PA-C  dicyclomine  (BENTYL ) 20 MG tablet Take 1 tablet (20 mg total) by mouth every 6 (six) hours as needed for spasms. Patient not taking: Reported on 08/04/2024 06/04/23   Stacia Glendia BRAVO, MD  famotidine  (PEPCID ) 20 MG tablet Take 1 tablet (20 mg total) by mouth 2 (two) times daily as needed for heartburn or indigestion. Patient not taking: Reported on 08/04/2024 02/22/24   Johnie Flaming A, NP  hydrOXYzine (VISTARIL) 25 MG capsule Take 25 mg by mouth every 6 (six) hours as needed. Patient not taking: Reported on 08/04/2024 10/21/23   [provider]  ipratropium (ATROVENT ) 0.03 % nasal spray Place 2 sprays into both nostrils 2 (two) times daily. Patient not taking: Reported on 08/04/2024 06/10/23   Christopher Savannah, PA-C  loperamide  (IMODIUM ) 2 MG capsule Take 1 capsule (2 mg total) by mouth 4 (four) times daily as needed for diarrhea or loose stools. Patient  not taking: Reported on 08/04/2024 02/22/24   Johnie Flaming A, NP  methylphenidate 27 MG PO CR tablet Take 27 mg by mouth every morning. Patient not taking: Reported on 08/04/2024 02/17/24   [provider]  naproxen  (NAPROSYN ) 500 MG tablet Take 1 tablet (500 mg total) by mouth 2 (two) times daily with a meal. Patient not taking: Reported on 08/04/2024 01/06/24   Raspet, Erin K, PA-C  nitroGLYCERIN  (NITRODUR - DOSED IN MG/24 HR) 0.2 mg/hr patch Use 1/4 patch daily to the affected area. Patient not taking: Reported on 08/04/2024 01/23/24   Rosalynn Camie CROME, MD   Venlafaxine HCl 150 MG TB24 Take 1 tablet by mouth daily.    [provider]  venlafaxine XR (EFFEXOR-XR) 37.5 MG 24 hr capsule Take 37.5 mg by mouth daily. Patient not taking: Reported on 08/04/2024    [provider]    Family History Family History  Problem Relation Age of Onset   Alcohol abuse Mother    Arthritis Mother    Hypertension Mother    Depression Mother    Healthy Mother    Healthy Father    Healthy Sister    Healthy Brother    Healthy Brother    Hypertension Maternal Grandmother    Depression Maternal Grandmother    COPD Maternal Grandmother    Arthritis Maternal Grandmother    Lung cancer Paternal Grandmother    Colon cancer Neg Hx    Esophageal cancer Neg Hx     Social History Social History   Tobacco Use   Smoking status: Never    Passive exposure: Yes   Smokeless tobacco: Never  Vaping Use   Vaping status: Former  Substance Use Topics   Alcohol use: Not Currently   Drug use: Never     Allergies   Dust mite extract and Other   Review of Systems Review of Systems  Per HPI  Physical Exam Triage Vital Signs ED Triage Vitals  Encounter Vitals Group     BP 08/07/24 1643 126/83     Girls Systolic BP Percentile --      Girls Diastolic BP Percentile --      Boys Systolic BP Percentile --      Boys Diastolic BP Percentile --      Pulse Rate 08/07/24 1643 87     Resp 08/07/24 1643 20     Temp 08/07/24 1643 98.4 F (36.9 C)     Temp src --      SpO2 08/07/24 1643 97 %     Weight --      Height --      Head Circumference --      Peak Flow --      Pain Score 08/07/24 1641 5     Pain Loc --      Pain Education --      Exclude from Growth Chart --    No data found.  Updated Vital Signs BP 126/83   Pulse 87   Temp 98.4 F (36.9 C)   Resp 20   LMP 07/20/2024 (Approximate)   SpO2 97%   Visual Acuity Right Eye Distance:   Left Eye Distance:   Bilateral Distance:    Right Eye Near:   Left Eye Near:     Bilateral Near:     Physical Exam Vitals and nursing note reviewed.  Constitutional:      Appearance: She is well-developed.  HENT:     Head: Normocephalic and atraumatic.  Mouth/Throat:     Mouth: Mucous membranes are moist.  Cardiovascular:     Rate and Rhythm: Normal rate.  Pulmonary:     Effort: Pulmonary effort is normal. No respiratory distress.  Abdominal:     General: Abdomen is flat.     Palpations: Abdomen is soft.     Tenderness: There is abdominal tenderness in the suprapubic area and left upper quadrant. There is no guarding or rebound.  Skin:    General: Skin is warm and dry.  Neurological:     General: No focal deficit present.     Mental Status: She is alert and oriented to person, place, and time.  Psychiatric:        Mood and Affect: Mood normal.        Behavior: Behavior normal.      UC Treatments / Results  Labs (all labs ordered are listed, but only abnormal results are displayed) Labs Reviewed  POCT URINE DIPSTICK - Abnormal; Notable for the following components:      Result Value   Ketones, POC UA trace (5) (*)    All other components within normal limits  URINE CULTURE  POCT URINE PREGNANCY    EKG   Radiology No results found.  Procedures Procedures (including critical care time)  Medications Ordered in UC Medications - No data to display  Initial Impression / Assessment and Plan / UC Course  I have reviewed the triage vital signs and the nursing notes.  Pertinent labs & imaging results that were available during my care of the patient were reviewed by me and considered in my medical decision making (see chart for details).  Vitals in triage reviewed, patient is hemodynamically stable.  Abdomen is soft with active bowel sounds.  Does have left upper quadrant tenderness and suprapubic tenderness.  Without rebound or guarding.  Low concern for acute abdomen.  Abdominal pain appears to be ongoing and has GI appointment next  week.  UA unremarkable, no evidence of UTI.  Will send for culture and contact if antibiotics are needed.  Symptomatic management discussed.  Plan of care, follow-up care return precautions given, no questions at this time.    Final Clinical Impressions(s) / UC Diagnoses   Final diagnoses:  Urinary frequency     Discharge Instructions      We are sending your urine off for culture and we will contact you if antibiotics are needed  In the meantime ensure you are drinking at least 64 ounces of water daily and avoiding sodas and juices as these can irritate the urinary tract  If you develop any pain you can alternate between 600 mg of ibuprofen  and 500 mg of Tylenol  every 4-6 hours as needed  Return to clinic for any new or urgent symptoms    ED Prescriptions   None    PDMP not reviewed this encounter.   Dreama, Micole Delehanty  N, FNP 08/07/24 1700

## 2024-08-07 NOTE — ED Triage Notes (Signed)
 Pt report for last 3 days she has had urinary frequency ,Ha and ABD pain.

## 2024-08-07 NOTE — Discharge Instructions (Signed)
 We are sending your urine off for culture and we will contact you if antibiotics are needed  In the meantime ensure you are drinking at least 64 ounces of water daily and avoiding sodas and juices as these can irritate the urinary tract  If you develop any pain you can alternate between 600 mg of ibuprofen  and 500 mg of Tylenol  every 4-6 hours as needed  Return to clinic for any new or urgent symptoms

## 2024-08-08 LAB — URINE CULTURE: Culture: NO GROWTH

## 2024-08-09 ENCOUNTER — Ambulatory Visit (HOSPITAL_COMMUNITY): Payer: Self-pay

## 2024-08-11 ENCOUNTER — Encounter: Payer: Self-pay | Admitting: Gastroenterology

## 2024-08-11 ENCOUNTER — Ambulatory Visit (INDEPENDENT_AMBULATORY_CARE_PROVIDER_SITE_OTHER): Admitting: Gastroenterology

## 2024-08-11 VITALS — BP 112/84 | HR 95 | Ht 70.0 in | Wt 253.0 lb

## 2024-08-11 DIAGNOSIS — K580 Irritable bowel syndrome with diarrhea: Secondary | ICD-10-CM

## 2024-08-11 DIAGNOSIS — K9041 Non-celiac gluten sensitivity: Secondary | ICD-10-CM | POA: Diagnosis not present

## 2024-08-11 MED ORDER — DICYCLOMINE HCL 20 MG PO TABS: 20.0000 mg | ORAL_TABLET | Freq: Four times a day (QID) | ORAL | 1 refills | Status: AC | PRN

## 2024-08-11 MED ORDER — CHOLESTYRAMINE 4 G PO PACK: PACK | ORAL | 0 refills | Status: AC

## 2024-08-11 NOTE — Patient Instructions (Addendum)
 We have sent the following medications to your pharmacy for you to pick up at your convenience: dicyclomine  and cholestyramine.   Please purchase the following medications over the counter and take as directed: IBGard take as directed on package.   Do a trial of strict gluten free and dairy free diet to to see if this helps with diarrhea.   You can consider an over the counter daily fiber supplement such as Metamucil.  Please follow up with Dr. Stacia in 2-3 months. We have scheduled you to see him on 10/22/24 at 8:50 am.  _______________________________________________________  If your blood pressure at your visit was 140/90 or greater, please contact your primary care physician to follow up on this.  _______________________________________________________  If you are age 22 or older, your body mass index should be between 23-30. Your Body mass index is 36.3 kg/m. If this is out of the aforementioned range listed, please consider follow up with your Primary Care Provider.  If you are age 22 or younger, your body mass index should be between 19-25. Your Body mass index is 36.3 kg/m. If this is out of the aformentioned range listed, please consider follow up with your Primary Care Provider.   ________________________________________________________  The [facility] providers would like to encourage you to use MYCHART to communicate with providers for non-urgent requests or questions.  Due to long hold times on the telephone, sending your provider a message by [facility] may be a faster and more efficient way to get a response.  Please allow 48 business hours for a response.  Please remember that this is for non-urgent requests.  _______________________________________________________  [facility] is using a team-based approach to care.  Your team is made up of your doctor and two to three APPS. Our APPS (Nurse Practitioners and Physician Assistants) work with your physician to  ensure care continuity for you. They are fully qualified to address your health concerns and develop a treatment plan. They communicate directly with your gastroenterologist to care for you. Seeing the Advanced Practice Practitioners on your physician's team can help you by facilitating care more promptly, often allowing for earlier appointments, access to diagnostic testing, procedures, and other specialty referrals.

## 2024-08-11 NOTE — Progress Notes (Signed)
 Discussed the use of AI scribe software for clinical note transcription with the patient, who gave verbal consent to proceed.  HPI : Colleen Maxwell is a 23 year old female who presents with recurrence of abdominal pain and diarrhea. I initially saw the patient in October of last year for symptoms of nausea as well as residual abdominal pain.  At that time, she stated that her symptoms had improved with limiting gluten and dairy in her diet.  She was advised to follow a strict gluten-free diet, take Metamucil daily and Bentyl  as needed for pain.  An H. pylori stool antigen was checked and was negative.  Today, she has been experiencing more persistent abdominal pain and diarrhea since September 2025. The symptoms are persistent, with diarrhea episodes occurring postprandially. Stools are mostly liquid or mushy, with variations in color including normal brown, light brown, and green. She experiences urgency with bowel movements, averaging around three per day, and occasionally has nocturnal bowel movements. No incontinence has occurred.  The abdominal pain is primarily located below the umbilicus and varies in location throughout the day, often intensifying pre-defecation. The pain is sometimes sharp and quick, particularly in the lower left quadrant.   She states that she had stopped her gluten-free and dairy free diet earlier in the year, and her symptoms did seem to worsen around that time.  She cites the expense of gluten-free alternatives as a reason for stopping the gluten-free diet. She has since resumed a gluten-free and dairy free diet as of about a month ago.  She has not been using Bentyl  recently and has not tried Ibogard or probiotics. She describes the smell of her stool as sometimes sulfur-like or resembling Play-Doh, which she finds concerning.  Her previous GI evaluation includes a negative celiac serologies, a right upper quadrant ultrasound showing fatty change, a  normal HIDA scan with a mildly depressed ejection fraction, and a negative H. pylori antigen test. Current medications include Effexor, which she is tapering off through her behavioral health provider.  Her weight has been fluctuating, mostly staying the same or decreasing.       HIDA scan Apr 07, 2023 IMPRESSION: Normal hepatobiliary scan with a slightly decreased gallbladder ejection fraction     RUQUS February 25, 2023 IMPRESSION: 1. The gallbladder and common bile duct are normal. 2. Diffuse increased echogenicity throughout the liver is nonspecific and may be seen with hepatic steatosis or other underlying intrinsic liver disease  Negative celiac panel, H. Pylori stool antigen  Past Medical History:  Diagnosis Date   Anxiety    Asthma    Depression    Hidradenitis suppurativa    History of pericarditis    Hydradenitis    PTSD (post-traumatic stress disorder)    Sciatica      Past Surgical History:  Procedure Laterality Date   TONSILLECTOMY     Family History  Problem Relation Age of Onset   Alcohol abuse Mother    Arthritis Mother    Hypertension Mother    Depression Mother    Healthy Mother    Healthy Father    Healthy Sister    Healthy Brother    Healthy Brother    Hypertension Maternal Grandmother    Depression Maternal Grandmother    COPD Maternal Grandmother    Arthritis Maternal Grandmother    Lung cancer Paternal Grandmother    Colon cancer Neg Hx    Esophageal cancer Neg Hx    Social History   Tobacco Use  Smoking status: Never    Passive exposure: Yes   Smokeless tobacco: Never  Vaping Use   Vaping status: Former  Substance Use Topics   Alcohol use: Not Currently   Drug use: Never   Current Outpatient Medications  Medication Sig Dispense Refill   albuterol  (VENTOLIN  HFA) 108 (90 Base) MCG/ACT inhaler INHALE 2 PUFFS INTO THE LUNGS EVERY 4 HOURS AS NEEDED FOR WHEEZING OR SHORTNESS OF BREATH 18 g 0   cetirizine  (ZYRTEC  ALLERGY) 10 MG  tablet Take 1 tablet (10 mg total) by mouth daily. (Patient not taking: Reported on 08/04/2024) 30 tablet 0   dicyclomine  (BENTYL ) 20 MG tablet Take 1 tablet (20 mg total) by mouth every 6 (six) hours as needed for spasms. (Patient not taking: Reported on 08/04/2024) 30 tablet 1   famotidine  (PEPCID ) 20 MG tablet Take 1 tablet (20 mg total) by mouth 2 (two) times daily as needed for heartburn or indigestion. (Patient not taking: Reported on 08/04/2024) 30 tablet 0   hydrOXYzine (VISTARIL) 25 MG capsule Take 25 mg by mouth every 6 (six) hours as needed. (Patient not taking: Reported on 08/04/2024)     ipratropium (ATROVENT ) 0.03 % nasal spray Place 2 sprays into both nostrils 2 (two) times daily. (Patient not taking: Reported on 08/04/2024) 30 mL 0   loperamide  (IMODIUM ) 2 MG capsule Take 1 capsule (2 mg total) by mouth 4 (four) times daily as needed for diarrhea or loose stools. (Patient not taking: Reported on 08/04/2024) 12 capsule 0   methylphenidate 27 MG PO CR tablet Take 27 mg by mouth every morning. (Patient not taking: Reported on 08/04/2024)     naproxen  (NAPROSYN ) 500 MG tablet Take 1 tablet (500 mg total) by mouth 2 (two) times daily with a meal. (Patient not taking: Reported on 08/04/2024) 30 tablet 0   nitroGLYCERIN  (NITRODUR - DOSED IN MG/24 HR) 0.2 mg/hr patch Use 1/4 patch daily to the affected area. (Patient not taking: Reported on 08/04/2024) 30 patch 1   Venlafaxine HCl 150 MG TB24 Take 1 tablet by mouth daily.     venlafaxine XR (EFFEXOR-XR) 37.5 MG 24 hr capsule Take 37.5 mg by mouth daily. (Patient not taking: Reported on 08/04/2024)     Vitamin D , Ergocalciferol , (DRISDOL ) 1.25 MG (50000 UNIT) CAPS capsule Take 50,000 Units by mouth once a week.     No current facility-administered medications for this visit.   Allergies  Allergen Reactions   Dust Mite Extract Other (See Comments)   Other     Dust mites and cockroaches per allergy test     Review of Systems: All systems reviewed  and negative except where noted in HPI.    No results found.  Physical Exam: BP 112/84   Pulse 95   Ht 5' 10 (1.778 m)   Wt 253 lb (114.8 kg)   LMP 07/20/2024 (Approximate)   SpO2 98%   BMI 36.30 kg/m  Constitutional: Pleasant,well-developed, African American female in no acute distress. HEENT: Normocephalic and atraumatic. Conjunctivae are normal. No scleral icterus. Neck supple.  Cardiovascular: Normal rate, regular rhythm.  Pulmonary/chest: Effort normal and breath sounds normal. No wheezing, rales or rhonchi. Abdominal: Soft, nondistended, multifocal, nonreproducible tenderness to palpation without rigidity or guarding. Bowel sounds active throughout. There are no masses palpable. No hepatomegaly. Extremities: no edema Neurological: Alert and oriented to person place and time. Skin: Skin is warm and dry. No rashes noted. Psychiatric: Normal mood and affect. Behavior is normal.  CBC    Component Value  Date/Time   WBC 9.4 08/04/2024 1705   WBC 7.0 02/22/2024 1727   RBC 4.36 08/04/2024 1705   RBC 4.42 02/22/2024 1727   HGB 12.7 08/04/2024 1705   HCT 38.7 08/04/2024 1705   PLT 290 08/04/2024 1705   MCV 89 08/04/2024 1705   MCH 29.1 08/04/2024 1705   MCH 28.3 02/22/2024 1727   MCHC 32.8 08/04/2024 1705   MCHC 32.1 02/22/2024 1727   RDW 13.1 08/04/2024 1705   LYMPHSABS 2.3 08/05/2023 1053   MONOABS 0.4 08/05/2023 1053   EOSABS 0.0 08/05/2023 1053   BASOSABS 0.0 08/05/2023 1053    CMP     Component Value Date/Time   NA 140 02/22/2024 1727   NA 143 07/23/2018 1639   K 3.9 02/22/2024 1727   CL 106 02/22/2024 1727   CO2 24 02/22/2024 1727   GLUCOSE 80 02/22/2024 1727   BUN 10 02/22/2024 1727   BUN 13 07/23/2018 1639   CREATININE 0.69 02/22/2024 1727   CALCIUM 9.3 02/22/2024 1727   PROT 7.0 02/22/2024 1727   PROT 7.5 07/23/2018 1639   ALBUMIN 3.9 02/22/2024 1727   ALBUMIN 4.8 07/23/2018 1639   AST 22 02/22/2024 1727   ALT 14 02/22/2024 1727   ALKPHOS 64  02/22/2024 1727   BILITOT 0.3 02/22/2024 1727   BILITOT 0.2 07/23/2018 1639   GFRNONAA >60 02/22/2024 1727   GFRAA CANCELED 07/23/2018 1639       Latest Ref Rng & Units 08/04/2024    5:05 PM 02/22/2024    5:27 PM 08/05/2023   10:53 AM  CBC EXTENDED  WBC 3.4 - 10.8 x10E3/uL 9.4  7.0  6.7   RBC 3.77 - 5.28 x10E6/uL 4.36  4.42  4.57   Hemoglobin 11.1 - 15.9 g/dL 87.2  87.4  86.8   HCT 34.0 - 46.6 % 38.7  38.9  40.3   Platelets 150 - 450 x10E3/uL 290  327  322.0   NEUT# 1.4 - 7.7 K/uL   3.9   Lymph# 0.7 - 4.0 K/uL   2.3       ASSESSMENT AND PLAN:   Irritable bowel syndrome with diarrhea/Non-celiac gluten sensitivity Recurrent abdominal pain and diarrhea with urgency and nocturnal symptoms. Symptoms partially improved with gluten and dairy-free diet. Differential includes Crohn's disease and ulcerative colitis, but very low suspicion for these conditions.  I suspect that her symptoms will continue to improve on a strict gluten-free diet, as they had previously.  I did suggest trying cholestyramine to help with her diarrhea.  The green color of her stool may indicate some degree of bile excess. - Prescribed cholestyramine for diarrhea, 1-2 times per day. - Renewed Bentyl  prescription for abdominal pain, as needed or scheduled every 6 hours. - Provided samples of Ibogard (peppermint oil) for abdominal pain. - Recommended strict gluten-free and dairy-free diet. - Consider daily fiber supplement such as Metamucil. - Ordered fecal calprotectin test to more definitively exclude IBD - Follow up in 2-3 months.  Recording duration: 23 minutes     I spent a total of 35 minutes reviewing the patient's medical record, interviewing and examining the patient, discussing her diagnosis and management of her condition going forward, and documenting in the medical record  Amaal Dimartino E. Stacia, MD [facility]   Ozell Heron HERO, MD

## 2024-08-19 ENCOUNTER — Ambulatory Visit: Payer: Self-pay | Admitting: Family Medicine

## 2024-08-22 ENCOUNTER — Ambulatory Visit (HOSPITAL_COMMUNITY): Admission: EM | Admit: 2024-08-22 | Discharge: 2024-08-22 | Disposition: A | Discharge status: Home / Self Care

## 2024-08-22 ENCOUNTER — Encounter (HOSPITAL_COMMUNITY): Payer: Self-pay | Admitting: Emergency Medicine

## 2024-08-22 DIAGNOSIS — J101 Influenza due to other identified influenza virus with other respiratory manifestations: Secondary | ICD-10-CM | POA: Diagnosis not present

## 2024-08-22 LAB — POC COVID19/FLU A&B COMBO
Covid Antigen, POC: NEGATIVE
Influenza A Antigen, POC: POSITIVE — AB
Influenza B Antigen, POC: NEGATIVE

## 2024-08-22 MED ORDER — PROMETHAZINE-DM 6.25-15 MG/5ML PO SYRP: 5.0000 mL | ORAL_SOLUTION | Freq: Four times a day (QID) | ORAL | 0 refills | Status: AC | PRN

## 2024-08-22 MED ORDER — ACETAMINOPHEN 325 MG PO TABS
975.0000 mg | ORAL_TABLET | Freq: Once | ORAL | Status: AC
  Administered 2024-08-22: 975 mg via ORAL

## 2024-08-22 MED ORDER — ACETAMINOPHEN 325 MG PO TABS
ORAL_TABLET | ORAL | Status: AC
  Filled 2024-08-22: qty 3

## 2024-08-22 MED ORDER — OSELTAMIVIR PHOSPHATE 75 MG PO CAPS: 75.0000 mg | ORAL_CAPSULE | Freq: Two times a day (BID) | ORAL | 0 refills | Status: AC

## 2024-08-22 NOTE — ED Triage Notes (Signed)
 Pt reports having chills, sore throat, hoarse voice, body aches for 2-3 days. Reports took temperature last night and 102-103. Hasn't taken medications for symptoms.

## 2024-08-22 NOTE — Discharge Instructions (Signed)
 You tested positive for flu A, this is a viral illness that typically last 5 to 7 days Take the Tamiflu  twice daily for the next 5 days to help decrease how long you are sick, this may cause nausea, vomiting or abdominal pain You can use the cough syrup up to 4 times daily as needed to help with cough and sore throat, this may cause drowsiness Alternate between 500 mg of Tylenol  and 800 mg of ibuprofen  every 4-6 hours to help with fever, body aches and chills Ensure you are getting plenty of rest and drinking at least 64 ounces of water daily  Seek follow-up care for prolonged symptoms or new concerning symptoms

## 2024-08-22 NOTE — ED Provider Notes (Signed)
 " [facility]    CSN: [id number] Arrival date & time: 08/22/24  0920      History   Chief Complaint Chief Complaint  Patient presents with   Chills   Generalized Body Aches    HPI Colleen Maxwell is a 23 y.o. female.   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    Past Medical History:  Diagnosis Date   Anxiety    Asthma    Depression    Hidradenitis suppurativa    History of pericarditis    Hydradenitis    PTSD (post-traumatic stress disorder)    Sciatica     Patient Active Problem List   Diagnosis Date Noted   Encounter for Papanicolaou smear of cervix 08/05/2024   Fatigue 08/05/2024   Irritable bowel syndrome 05/25/2024   Anxiety 05/25/2024   Mold growth in home 05/25/2024   Generalized abdominal pain 07/10/2022   Generalized anxiety disorder 07/10/2022   Lumbar radiculopathy 03/29/2021   Pre-diabetes 10/12/2018   Abnormal uterine bleeding 07/23/2018   Sleep disturbance 08/21/2017   Obesity 11/13/2016   Mild intermittent asthma with acute exacerbation 09/14/2015   Perennial allergic rhinitis 09/14/2015    Past Surgical History:  Procedure Laterality Date   TONSILLECTOMY      OB History   No obstetric history on file.      Home Medications    Prior to Admission medications  Medication Sig Start Date End Date Taking? Authorizing Provider  oseltamivir  (TAMIFLU ) 75 MG capsule Take 1 capsule (75 mg total) by mouth every 12 (twelve) hours. 08/22/24  Yes Daphine Loch  N, FNP  promethazine -dextromethorphan  (PROMETHAZINE -DM) 6.25-15 MG/5ML syrup Take 5 mLs by mouth 4 (four) times daily as needed for cough. 08/22/24  Yes Kaylina Cahue  N, FNP  venlafaxine XR (EFFEXOR-XR) 150 MG 24 hr capsule Take 150 mg by mouth daily. 07/09/24  Yes [provider]  albuterol  (VENTOLIN  HFA) 108 (90 Base) MCG/ACT inhaler INHALE 2 PUFFS INTO THE LUNGS EVERY 4 HOURS AS NEEDED FOR WHEEZING OR SHORTNESS OF BREATH 05/05/24   Ozell Heron HERO, MD  cetirizine  (ZYRTEC  ALLERGY) 10 MG tablet Take 1 tablet (10 mg total) by mouth daily. 06/10/23   Christopher Savannah, PA-C  cholestyramine  (QUESTRAN ) 4 g packet Take 1-2 packs a day 08/11/24   Stacia Glendia BRAVO, MD  dicyclomine  (BENTYL ) 20 MG tablet Take 1 tablet (20 mg total) by mouth every 6 (six) hours as needed for spasms. 08/11/24   Stacia Glendia BRAVO, MD  hydrOXYzine (VISTARIL) 25 MG capsule Take 25 mg by mouth every 6 (six) hours as needed. Patient not taking: Reported on 08/11/2024 10/21/23   [provider]  methylphenidate 27 MG PO CR tablet Take 27 mg by mouth every morning. 02/17/24   [provider]  Vitamin D , Ergocalciferol , (DRISDOL ) 1.25 MG (50000 UNIT) CAPS capsule Take 50,000 Units by mouth once a week. 07/09/24   [provider]    Family History Family History  Problem Relation Age of Onset   Alcohol abuse Mother    Arthritis Mother  Hypertension Mother    Depression Mother    Healthy Mother    Healthy Father    Healthy Sister    Healthy Brother    Healthy Brother    Hypertension Maternal Grandmother    Depression Maternal Grandmother    COPD Maternal Grandmother    Arthritis Maternal Grandmother    Lung cancer Paternal Grandmother    Colon cancer Neg Hx    Esophageal cancer Neg Hx     Social History Social History[1]   Allergies   Dust mite extract and Other   Review of Systems Review of Systems  Per HPI  Physical Exam Triage Vital Signs ED Triage Vitals  Encounter Vitals Group     BP 08/22/24 1017  113/71     Girls Systolic BP Percentile --      Girls Diastolic BP Percentile --      Boys Systolic BP Percentile --      Boys Diastolic BP Percentile --      Pulse Rate 08/22/24 1017 (!) 114     Resp 08/22/24 1017 18     Temp 08/22/24 1017 (!) 101.8 F (38.8 C)     Temp Source 08/22/24 1017 Oral     SpO2 08/22/24 1017 96 %     Weight --      Height --      Head Circumference --      Peak Flow --      Pain Score 08/22/24 1015 6     Pain Loc --      Pain Education --      Exclude from Growth Chart --    No data found.  Updated Vital Signs BP 113/71   Pulse (!) 114   Temp (!) 101.8 F (38.8 C) (Oral)   Resp 18   LMP 08/21/2024 (Exact Date)   SpO2 96%   Visual Acuity Right Eye Distance:   Left Eye Distance:   Bilateral Distance:    Right Eye Near:   Left Eye Near:    Bilateral Near:     Physical Exam Vitals and nursing note reviewed.  Constitutional:      Appearance: Normal appearance.  HENT:     Head: Normocephalic and atraumatic.     Right Ear: External ear normal.     Left Ear: External ear normal.     Nose: Congestion present.     Mouth/Throat:     Mouth: Mucous membranes are moist.     Pharynx: Posterior oropharyngeal erythema present.  Eyes:     Conjunctiva/sclera: Conjunctivae normal.  Cardiovascular:     Rate and Rhythm: Normal rate and regular rhythm.     Heart sounds: Normal heart sounds. No murmur heard. Pulmonary:     Effort: Pulmonary effort is normal. No respiratory distress.     Breath sounds: Normal breath sounds. No wheezing.  Skin:    General: Skin is warm and dry.  Neurological:     General: No focal deficit present.     Mental Status: She is alert and oriented to person, place, and time.      UC Treatments / Results  Labs (all labs ordered are listed, but only abnormal results are displayed) Labs Reviewed  POC COVID19/FLU A&B COMBO - Abnormal; Notable for the following components:      Result Value   Influenza A Antigen, POC  Positive (*)    All other components within normal limits    EKG   Radiology No results found.  Procedures Procedures (including critical care time)  Medications Ordered in UC Medications  acetaminophen  (TYLENOL ) tablet 975 mg (975 mg Oral Given 08/22/24 1037)    Initial Impression / Assessment and Plan / UC Course  I have reviewed the triage vital signs and the nursing notes.  Pertinent labs & imaging results that were available during my care of the patient were reviewed by me and considered in my medical decision making (see chart for details).  Vitals in triage reviewed, patient is hemodynamically stable.  Lungs vesicular, heart with regular rate and rhythm.  Initially febrile and tachycardic, given Tylenol .  Congestion, rhinorrhea and postnasal drip.  POC testing positive for influenza A.  Patient reports most symptoms started yesterday, will start on Tamiflu .  Symptomatic management for viral illness discussed.  Does have history of asthma, overall presentation is reassuring and lungs are without wheezing.  No current wheezing or shortness of breath.  Symptomatic management discussed.  Plan of care, follow-up care and return precautions given, no questions at this time.  Work note provided.    Final Clinical Impressions(s) / UC Diagnoses   Final diagnoses:  Influenza A     Discharge Instructions      You tested positive for flu A, this is a viral illness that typically last 5 to 7 days Take the Tamiflu  twice daily for the next 5 days to help decrease how long you are sick, this may cause nausea, vomiting or abdominal pain You can use the cough syrup up to 4 times daily as needed to help with cough and sore throat, this may cause drowsiness Alternate between 500 mg of Tylenol  and 800 mg of ibuprofen  every 4-6 hours to help with fever, body aches and chills Ensure you are getting plenty of rest and drinking at least 64 ounces of water daily  Seek follow-up care  for prolonged symptoms or new concerning symptoms     ED Prescriptions     Medication Sig Dispense Auth. Provider   oseltamivir  (TAMIFLU ) 75 MG capsule Take 1 capsule (75 mg total) by mouth every 12 (twelve) hours. 10 capsule Dreama, Aaditya Letizia  N, FNP   promethazine -dextromethorphan (PROMETHAZINE -DM) 6.25-15 MG/5ML syrup Take 5 mLs by mouth 4 (four) times daily as needed for cough. 118 mL Dreama, Javious Hallisey  N, FNP      PDMP not reviewed this encounter.    [1]  Social History Tobacco Use   Smoking status: Never    Passive exposure: Yes   Smokeless tobacco: Never  Vaping Use   Vaping status: Former  Substance Use Topics   Alcohol use: Not Currently   Drug use: Never     Dreama Rama SAILOR, FNP 08/22/24 1102  "

## 2024-09-05 ENCOUNTER — Other Ambulatory Visit: Payer: Self-pay

## 2024-09-05 ENCOUNTER — Emergency Department (HOSPITAL_COMMUNITY)
Admission: EM | Admit: 2024-09-05 | Discharge: 2024-09-06 | Disposition: A | Discharge status: Home / Self Care | Attending: Emergency Medicine | Admitting: Emergency Medicine

## 2024-09-05 ENCOUNTER — Encounter (HOSPITAL_COMMUNITY): Payer: Self-pay | Admitting: *Deleted

## 2024-09-05 DIAGNOSIS — R509 Fever, unspecified: Secondary | ICD-10-CM | POA: Diagnosis present

## 2024-09-05 DIAGNOSIS — J069 Acute upper respiratory infection, unspecified: Secondary | ICD-10-CM | POA: Insufficient documentation

## 2024-09-05 DIAGNOSIS — D72829 Elevated white blood cell count, unspecified: Secondary | ICD-10-CM | POA: Diagnosis not present

## 2024-09-05 DIAGNOSIS — J45909 Unspecified asthma, uncomplicated: Secondary | ICD-10-CM | POA: Insufficient documentation

## 2024-09-05 DIAGNOSIS — B9789 Other viral agents as the cause of diseases classified elsewhere: Secondary | ICD-10-CM

## 2024-09-05 LAB — URINALYSIS, ROUTINE W REFLEX MICROSCOPIC
Bacteria, UA: NONE SEEN
Bilirubin Urine: NEGATIVE
Glucose, UA: NEGATIVE mg/dL
Ketones, ur: NEGATIVE mg/dL
Leukocytes,Ua: NEGATIVE
Nitrite: NEGATIVE
Protein, ur: NEGATIVE mg/dL
Specific Gravity, Urine: 1.014 (ref 1.005–1.030)
pH: 6 (ref 5.0–8.0)

## 2024-09-05 LAB — CBC
HCT: 37 % (ref 36.0–46.0)
Hemoglobin: 12.4 g/dL (ref 12.0–15.0)
MCH: 29.3 pg (ref 26.0–34.0)
MCHC: 33.5 g/dL (ref 30.0–36.0)
MCV: 87.5 fL (ref 80.0–100.0)
Platelets: 297 K/uL (ref 150–400)
RBC: 4.23 MIL/uL (ref 3.87–5.11)
RDW: 13 % (ref 11.5–15.5)
WBC: 16.7 K/uL — ABNORMAL HIGH (ref 4.0–10.5)
nRBC: 0 % (ref 0.0–0.2)

## 2024-09-05 LAB — RESP PANEL BY RT-PCR (RSV, FLU A&B, COVID)  RVPGX2
Influenza A by PCR: NEGATIVE
Influenza B by PCR: NEGATIVE
Resp Syncytial Virus by PCR: NEGATIVE
SARS Coronavirus 2 by RT PCR: NEGATIVE

## 2024-09-05 LAB — COMPREHENSIVE METABOLIC PANEL WITH GFR
ALT: 11 U/L (ref 0–44)
AST: 22 U/L (ref 15–41)
Albumin: 4.5 g/dL (ref 3.5–5.0)
Alkaline Phosphatase: 76 U/L (ref 38–126)
Anion gap: 12 (ref 5–15)
BUN: 11 mg/dL (ref 6–20)
CO2: 23 mmol/L (ref 22–32)
Calcium: 9.3 mg/dL (ref 8.9–10.3)
Chloride: 101 mmol/L (ref 98–111)
Creatinine, Ser: 0.83 mg/dL (ref 0.44–1.00)
GFR, Estimated: >60 mL/min
Glucose, Bld: 116 mg/dL — ABNORMAL HIGH (ref 70–99)
Potassium: 3.8 mmol/L (ref 3.5–5.1)
Sodium: 137 mmol/L (ref 135–145)
Total Bilirubin: 0.6 mg/dL (ref 0.0–1.2)
Total Protein: 7.3 g/dL (ref 6.5–8.1)

## 2024-09-05 LAB — LIPASE, BLOOD: Lipase: 13 U/L (ref 11–51)

## 2024-09-05 LAB — HCG, SERUM, QUALITATIVE: Preg, Serum: NEGATIVE

## 2024-09-05 NOTE — ED Triage Notes (Signed)
 Fever body aches since with vomiting she had the flu last week  today she feels terrible again  lmp  dec 19th  she last took tylenol  500mg  at 1900 today she has had chills also

## 2024-09-06 ENCOUNTER — Emergency Department (HOSPITAL_COMMUNITY)

## 2024-09-06 DIAGNOSIS — J069 Acute upper respiratory infection, unspecified: Secondary | ICD-10-CM | POA: Diagnosis not present

## 2024-09-06 MED ORDER — ACETAMINOPHEN 500 MG PO TABS
1000.0000 mg | ORAL_TABLET | Freq: Once | ORAL | Status: AC
  Administered 2024-09-06: 1000 mg via ORAL
  Filled 2024-09-06: qty 2

## 2024-09-06 MED ORDER — LACTATED RINGERS IV BOLUS
1000.0000 mL | Freq: Once | INTRAVENOUS | Status: AC
  Administered 2024-09-06: 1000 mL via INTRAVENOUS

## 2024-09-06 NOTE — ED Provider Notes (Signed)
 " [place] [facility] AT [facility] Provider Note   CSN: [id number] Arrival date & time: 09/05/24  2112     Patient presents with: Fever   Colleen Maxwell is a 24 y.o. female.   Patient is here for evaluation of fever, body aches, and one episode of emesis that began yesterday morning. Patient tested positive for flu last week. She had been feeling better then began having symptoms again yesterday. History of asthma. She used her inhaler once yesterday with some relief. Denies any shortness of breath, chest pain, nausea, or vomiting today. Denies dizziness or syncope. Last took Tylenol  1900 just prior to arrival at ED.  The history is provided by the patient.  Fever Onset quality:  Sudden      Prior to Admission medications  Medication Sig Start Date End Date Taking? Authorizing Provider  albuterol  (VENTOLIN  HFA) 108 (90 Base) MCG/ACT inhaler INHALE 2 PUFFS INTO THE LUNGS EVERY 4 HOURS AS NEEDED FOR WHEEZING OR SHORTNESS OF BREATH 05/05/24   Ozell Heron HERO, MD  cetirizine  (ZYRTEC  ALLERGY) 10 MG tablet Take 1 tablet (10 mg total) by mouth daily. 06/10/23   Christopher Savannah, PA-C  cholestyramine  (QUESTRAN ) 4 g packet Take 1-2 packs a day 08/11/24   Stacia Glendia BRAVO, MD  dicyclomine  (BENTYL ) 20 MG tablet Take 1 tablet (20 mg total) by mouth every 6 (six) hours as needed for spasms. 08/11/24   Stacia Glendia BRAVO, MD  hydrOXYzine (VISTARIL) 25 MG capsule Take 25 mg by mouth every 6 (six) hours as needed. Patient not taking: Reported on 08/11/2024 10/21/23   [provider]  methylphenidate 27 MG PO CR tablet Take 27 mg by mouth every morning. 02/17/24   [provider]  oseltamivir  (TAMIFLU ) 75 MG capsule Take 1 capsule (75 mg total) by mouth every 12 (twelve) hours. 08/22/24   Dreama, [place]  N, FNP  promethazine -dextromethorphan (PROMETHAZINE -DM) 6.25-15 MG/5ML syrup Take 5 mLs by mouth 4 (four) times daily as needed for cough. 08/22/24    Dreama, [place]  N, FNP  venlafaxine XR (EFFEXOR-XR) 150 MG 24 hr capsule Take 150 mg by mouth daily. 07/09/24   [provider]  Vitamin D , Ergocalciferol , (DRISDOL ) 1.25 MG (50000 UNIT) CAPS capsule Take 50,000 Units by mouth once a week. 07/09/24   [provider]    Allergies: Dust mite extract and Other    Review of Systems  Constitutional:  Positive for fever.    Updated Vital Signs BP 94/68 (BP Location: Left Arm)   Pulse (!) 101   Temp 99.5 F (37.5 C)   Resp 19   Ht 5' 10 (1.778 m)   Wt 114.8 kg   LMP 08/21/2024 (Exact Date)   SpO2 97%   BMI 36.31 kg/m   Physical Exam Vitals and nursing note reviewed.  Constitutional:      General: She is not in acute distress.    Appearance: Normal appearance. She is obese. She is not ill-appearing, toxic-appearing or diaphoretic.  HENT:     Head: Normocephalic and atraumatic.     Nose: Nose normal.     Mouth/Throat:     Mouth: Mucous membranes are moist.  Eyes:     General: No scleral icterus.    Extraocular Movements: Extraocular movements intact.     Conjunctiva/sclera: Conjunctivae normal.  Cardiovascular:     Rate and Rhythm: Normal rate and regular rhythm.     Pulses: Normal pulses.     Heart sounds: Normal heart sounds.  Pulmonary:  Effort: Pulmonary effort is normal. No respiratory distress.     Breath sounds: Normal breath sounds. No stridor. No wheezing, rhonchi or rales.  Abdominal:     General: Abdomen is flat. Bowel sounds are normal. There is no distension.     Palpations: Abdomen is soft.     Tenderness: There is no abdominal tenderness. There is no guarding.  Musculoskeletal:        General: Normal range of motion.     Cervical back: Normal range of motion.     Right lower leg: No edema.     Left lower leg: No edema.  Skin:    General: Skin is warm and dry.     Capillary Refill: Capillary refill takes less than 2 seconds.     Coloration: Skin is not jaundiced or pale.   Neurological:     Mental Status: She is alert and oriented to person, place, and time.     (all labs ordered are listed, but only abnormal results are displayed) Labs Reviewed  COMPREHENSIVE METABOLIC PANEL WITH GFR - Abnormal; Notable for the following components:      Result Value   Glucose, Bld 116 (*)    All other components within normal limits  CBC - Abnormal; Notable for the following components:   WBC 16.7 (*)    All other components within normal limits  URINALYSIS, ROUTINE W REFLEX MICROSCOPIC - Abnormal; Notable for the following components:   APPearance HAZY (*)    Hgb urine dipstick SMALL (*)    All other components within normal limits  RESP PANEL BY RT-PCR (RSV, FLU A&B, COVID)  RVPGX2  LIPASE, BLOOD  HCG, SERUM, QUALITATIVE    EKG: None  Radiology: DG Chest 2 View Result Date: 09/06/2024 EXAM: 2 VIEW(S) XRAY OF THE CHEST 09/06/2024 02:12:00 AM COMPARISON: 06/09/2021. CLINICAL HISTORY: fever FINDINGS: LUNGS AND PLEURA: No focal pulmonary opacity. No pleural effusion. No pneumothorax. HEART AND MEDIASTINUM: No acute abnormality of the cardiac and mediastinal silhouettes. BONES AND SOFT TISSUES: No acute osseous abnormality. IMPRESSION: 1. No active cardiopulmonary disease. Electronically signed by: Franky Crease MD 09/06/2024 02:15 AM EST RP Workstation: [id number]     Procedures   Medications Ordered in the ED  acetaminophen  (TYLENOL ) tablet 1,000 mg (1,000 mg Oral Given 09/06/24 0703)  lactated ringers  bolus 1,000 mL (0 mLs Intravenous Stopped 09/06/24 0826)     Patient presents to the ED for concern of flulike symptoms, this involves an extensive number of treatment options, and is a complaint that carries with it a high risk of complications and morbidity.  The differential diagnosis includes viral URI, influenza, COVID, RSV, pneumonia.   Co morbidities that complicate the patient evaluation  Pre-diabetes IBS Asthma   Additional history  obtained:  Additional history obtained from Outside Medical Records   External records from outside source obtained and reviewed including previous lab review for comparison.   Lab Tests:  I Ordered, and personally interpreted labs.  The pertinent results include:  Elevated WBC of 16.7, otherwise blood work is unremarkable. Respiratory panel negative. Urinalysis not indicative of infection. Negative urine pregnancy. Normal lipase.   Imaging Studies ordered:  I ordered imaging studies including chest x-ray  I independently visualized and interpreted imaging which showed no acute cardiopulmonary findings I agree with the radiologist interpretation   Medicines ordered and prescription drug management:  I ordered medication including Tylenol  and LR bolus  for fever and soft BP.  Reevaluation of the patient after these medicines showed that  the patient improved   Problem List / ED Course:     Viral URI. Physical exam, vitals, and history are consistent with viral URI with no acute or concerning findings. Low-grade fever and soft BP (94/68) compared to previous vitals, given Tylenol  and LR bolus. Leukocytosis in the setting of viral URI and vomiting. Respiratory panel negative.  Spoke with patient about diagnosis, outpatient treatment, return precautions and they verbalized understanding.  Stable for discharge.   Reevaluation:  After the interventions noted above, I reevaluated the patient and found that they have :improved   Dispostion:  After consideration of the diagnostic results and the patients response to treatment, I feel that the patent would benefit from supportive care in the home setting and symptom management using over-the-counter medications with close follow-up in primary care for evaluation of improvement of symptoms.  Return precautions given.                                   Medical Decision Making Risk OTC drugs.   This note was produced using [employer]. While the provider has reviewed and verified all clinical information, transcription errors may remain.    Final diagnoses:  Viral respiratory illness    ED Discharge Orders     None          Colleen Almarie LABOR, PA-C 09/06/24 0828    Colleen Ozell LABOR, DO 09/06/24 1549  "

## 2024-09-06 NOTE — Discharge Instructions (Addendum)
 It was a pleasure meeting with you today.  Your symptoms are likely due to a viral upper respiratory illness.  Read the instructions below on reasons to return to the emergency department and to learn more about your diagnosis.  Use over the counter medications for symptomatic relief as we discussed (musinex as a decongestant, Tylenol  for fever/pain, Motrin /Ibuprofen  for muscle aches). Followup with your primary care doctor in 4 days if your symptoms persist.  You're more than welcome to return to the emergency department if symptoms worsen or become concerning.  Upper Respiratory Infection, Adult  An upper respiratory infection (URI) is also sometimes known as the common cold. Most people improve within 1 week, but symptoms can last up to 2 weeks. A residual cough may last even longer.   URI is most commonly caused by a virus. Viruses are NOT treated with antibiotics. You can easily spread the virus to others by oral contact. This includes kissing, sharing a glass, coughing, or sneezing. Touching your mouth or nose and then touching a surface, which is then touched by another person, can also spread the virus.   TREATMENT  Treatment is directed at relieving symptoms. There is no cure. Antibiotics are not effective, because the infection is caused by a virus, not by bacteria. Treatment may include:  Increased fluid intake. Sports drinks offer valuable electrolytes, sugars, and fluids.  Breathing heated mist or steam (vaporizer or shower).  Eating chicken soup or other clear broths, and maintaining good nutrition.  Getting plenty of rest.  Using gargles or lozenges for comfort.  Controlling fevers with ibuprofen  or acetaminophen  as directed by your caregiver.  Increasing usage of your inhaler if you have asthma.  Return to work when your temperature has returned to normal.   SEEK MEDICAL CARE IF:  After the first few days, you feel you are getting worse rather than better.  You develop worsening  shortness of breath, or brown or red sputum. These may be signs of pneumonia.  You develop yellow or brown nasal discharge or pain in the face, especially when you bend forward. These may be signs of sinusitis.  You develop a fever, swollen neck glands, pain with swallowing, or white areas in the back of your throat. These may be signs of strep throat.

## 2024-10-22 ENCOUNTER — Ambulatory Visit: Admitting: Gastroenterology
